# Patient Record
Sex: Female | Born: 1965 | Race: White | Hispanic: No | Marital: Married | Smoking: Never smoker
Health system: Southern US, Community
[De-identification: ages and names within clinical notes are randomized; demographics above are authoritative.]

## PROBLEM LIST (undated history)

## (undated) DIAGNOSIS — I10 Essential (primary) hypertension: Secondary | ICD-10-CM

## (undated) DIAGNOSIS — E119 Type 2 diabetes mellitus without complications: Secondary | ICD-10-CM

## (undated) DIAGNOSIS — IMO0002 Reserved for concepts with insufficient information to code with codable children: Secondary | ICD-10-CM

## (undated) DIAGNOSIS — C801 Malignant (primary) neoplasm, unspecified: Secondary | ICD-10-CM

## (undated) HISTORY — PX: THYROIDECTOMY: SHX17

---

## 2010-03-17 DIAGNOSIS — C73 Malignant neoplasm of thyroid gland: Secondary | ICD-10-CM | POA: Insufficient documentation

## 2010-08-12 DIAGNOSIS — H8090 Unspecified otosclerosis, unspecified ear: Secondary | ICD-10-CM | POA: Insufficient documentation

## 2011-01-20 DIAGNOSIS — H902 Conductive hearing loss, unspecified: Secondary | ICD-10-CM | POA: Insufficient documentation

## 2012-08-24 DIAGNOSIS — R911 Solitary pulmonary nodule: Secondary | ICD-10-CM | POA: Insufficient documentation

## 2012-12-21 DIAGNOSIS — M5414 Radiculopathy, thoracic region: Secondary | ICD-10-CM | POA: Insufficient documentation

## 2013-02-10 DIAGNOSIS — M722 Plantar fascial fibromatosis: Secondary | ICD-10-CM | POA: Insufficient documentation

## 2013-09-11 DIAGNOSIS — E785 Hyperlipidemia, unspecified: Secondary | ICD-10-CM | POA: Insufficient documentation

## 2013-10-03 ENCOUNTER — Encounter: Payer: Self-pay | Admitting: Sports Medicine

## 2013-10-03 ENCOUNTER — Ambulatory Visit (INDEPENDENT_AMBULATORY_CARE_PROVIDER_SITE_OTHER): Payer: BC Managed Care – PPO | Admitting: Sports Medicine

## 2013-10-03 VITALS — BP 155/88 | HR 70 | Wt 188.0 lb

## 2013-10-03 DIAGNOSIS — M722 Plantar fascial fibromatosis: Secondary | ICD-10-CM | POA: Insufficient documentation

## 2013-10-03 NOTE — Progress Notes (Signed)
Patient ID: Nancy Poole, female   DOB: 1966/10/14, 47 y.o.   MRN: [id number]  Subjective:    CC:  "Both heels hurt when I stand for a long time"  HPI: This is a 47 year old woman who comes in today for pain at the heel of her feet after long period of time. She reports the pain started around march this year. She works at [employer] where she stands on her feet for over 6 hours of the day. Since September, the pain has gotten worse because she now stands on her feet for about 12 hours at time.  She has tried NSAID, ice compression and night splint to no avail. The pain radiates to both big toes but does not radiate to her legs. She is interested in getting a shot today.  Past medical history, Surgical history, Family history not pertinant except as noted below, Social history, Allergies, and medications have been entered into the medical record, reviewed, and no changes needed.   Review of Systems: No headache, visual changes, nausea, vomiting, diarrhea, constipation, dizziness, abdominal pain, skin rash, fevers, chills, night sweats, swollen lymph nodes, weight loss, chest pain, body aches, joint swelling, muscle aches, shortness of breath, mood changes, visual or auditory hallucinations.  Objective:    General: Well Developed, well nourished, and in no acute distress.  Neuro: Alert and oriented x3, extra-ocular muscles intact, sensation grossly intact.  HEENT: Normocephalic, atraumatic, pupils equal round reactive to light, neck supple, no masses, no lymphadenopathy, thyroid nonpalpable.  Skin: Warm and dry, no rashes noted.  Cardiac: Regular rate and rhythm, no murmurs rubs or gallops.  Respiratory: Clear to auscultation bilaterally. Not using accessory muscles, speaking in full sentences.  Abdominal: Soft, nontender, nondistended, positive bowel sounds, no masses, no organomegaly.  Bilateral feet: No visible erythema or swelling. Range of motion is full in all directions. Strength  is 5/5 in all directions. No hallux valgus. Bilateral pes cavus No abnormal callus noted. No pain over the navicular prominence, or base of fifth metatarsal. Tender to palpation of the calcaneal insertion of plantar fascia bilaterally. No pain at the Achilles insertion. No pain over the calcaneal bursa. No pain of the retrocalcaneal bursa. No tenderness to palpation over the tarsals, metatarsals, or phalanges. No hallux rigidus or limitus. No tenderness palpation over interphalangeal joints. No pain with compression of the metatarsal heads. Neurovascularly intact distally.  Procedure: Real-time Ultrasound Guided Injection of left plantar fascia Device: GE Logiq E  Verbal informed consent obtained.  Time-out conducted.  Noted no overlying erythema, induration, or other signs of local infection.  Skin prepped in a sterile fashion.  Local anesthesia: Topical Ethyl chloride.  With sterile technique and under real time ultrasound guidance:  25-gauge needle advanced just deep to the calcaneal insertion of the plantar fascia, 1 cc Kenalog 40, 3 cc lidocaine injected easily. Completed without difficulty  Pain immediately resolved suggesting accurate placement of the medication.  Advised to call if fevers/chills, erythema, induration, drainage, or persistent bleeding.  Images permanently stored and available for review in the ultrasound unit.  Impression: Technically successful ultrasound guided injection.  Procedure: Real-time Ultrasound Guided Injection of right plantar fascia Device: GE Logiq E  Verbal informed consent obtained.  Time-out conducted.  Noted no overlying erythema, induration, or other signs of local infection.  Skin prepped in a sterile fashion.  Local anesthesia: Topical Ethyl chloride.  With sterile technique and under real time ultrasound guidance:  25-gauge needle advanced just deep to the  calcaneal insertion of the plantar fascia, 1 cc Kenalog 40, 3 cc lidocaine  injected easily. Completed without difficulty  Pain immediately resolved suggesting accurate placement of the medication.  Advised to call if fevers/chills, erythema, induration, drainage, or persistent bleeding.  Images permanently stored and available for review in the ultrasound unit.  Impression: Technically successful ultrasound guided injection.  Impression and Recommendations:    The patient was counselled, risk factors were discussed, anticipatory guidance given.  Plantar Fascitis: -We will inject both feet with triamcinolone and lidocaine . She was given home exercise handout on plantar fascitis -Patient advised to make follow up for  make custom orthotics  -Follow up in a month to monitor improvement

## 2013-10-03 NOTE — Assessment & Plan Note (Signed)
Failed conservative measures. Bilateral injection as above. Return to see me for custom orthotics. Home exercises given.

## 2013-10-23 ENCOUNTER — Encounter: Payer: Self-pay | Admitting: Sports Medicine

## 2013-10-23 ENCOUNTER — Ambulatory Visit (INDEPENDENT_AMBULATORY_CARE_PROVIDER_SITE_OTHER): Payer: BC Managed Care – PPO | Admitting: Sports Medicine

## 2013-10-23 VITALS — BP 150/92 | HR 84 | Wt 184.0 lb

## 2013-10-23 DIAGNOSIS — Z299 Encounter for prophylactic measures, unspecified: Secondary | ICD-10-CM

## 2013-10-23 DIAGNOSIS — M722 Plantar fascial fibromatosis: Secondary | ICD-10-CM

## 2013-10-23 DIAGNOSIS — Z0000 Encounter for general adult medical examination without abnormal findings: Secondary | ICD-10-CM | POA: Insufficient documentation

## 2013-10-23 DIAGNOSIS — K219 Gastro-esophageal reflux disease without esophagitis: Secondary | ICD-10-CM | POA: Insufficient documentation

## 2013-10-23 DIAGNOSIS — Z23 Encounter for immunization: Secondary | ICD-10-CM

## 2013-10-23 MED ORDER — OMEPRAZOLE 40 MG PO CPDR: 40.0000 mg | DELAYED_RELEASE_CAPSULE | Freq: Every day | ORAL | Status: DC

## 2013-10-23 NOTE — Assessment & Plan Note (Signed)
Flu shot

## 2013-10-23 NOTE — Assessment & Plan Note (Signed)
Doing much better after the injection. Custom orthotics as above. Return in one month, consider repeat injection if no better.

## 2013-10-23 NOTE — Progress Notes (Signed)
    Patient was fitted for a : standard, cushioned, semi-rigid orthotic. The orthotic was heated and afterward the patient stood on the orthotic blank positioned on the orthotic stand. The patient was positioned in subtalar neutral position and 10 degrees of ankle dorsiflexion in a weight bearing stance. After completion of molding, a stable base was applied to the orthotic blank. The blank was ground to a stable position for weight bearing. Size: 7 Base: Blue EVA Additional Posting and Padding: None The patient ambulated these, and they were very comfortable.  I spent 40 minutes with this patient, greater than 50% was face-to-face time counseling regarding the below diagnosis.   

## 2013-10-23 NOTE — Assessment & Plan Note (Signed)
Refilling Prilosec.

## 2013-11-13 ENCOUNTER — Encounter: Payer: Self-pay | Admitting: Sports Medicine

## 2013-11-13 ENCOUNTER — Ambulatory Visit (INDEPENDENT_AMBULATORY_CARE_PROVIDER_SITE_OTHER): Payer: BC Managed Care – PPO | Admitting: Sports Medicine

## 2013-11-13 ENCOUNTER — Other Ambulatory Visit: Payer: Self-pay | Admitting: Sports Medicine

## 2013-11-13 ENCOUNTER — Ambulatory Visit (INDEPENDENT_AMBULATORY_CARE_PROVIDER_SITE_OTHER): Payer: BC Managed Care – PPO

## 2013-11-13 VITALS — BP 148/86 | HR 78 | Wt 191.0 lb

## 2013-11-13 DIAGNOSIS — M722 Plantar fascial fibromatosis: Secondary | ICD-10-CM

## 2013-11-13 DIAGNOSIS — M5410 Radiculopathy, site unspecified: Secondary | ICD-10-CM | POA: Insufficient documentation

## 2013-11-13 DIAGNOSIS — M47817 Spondylosis without myelopathy or radiculopathy, lumbosacral region: Secondary | ICD-10-CM

## 2013-11-13 DIAGNOSIS — M5416 Radiculopathy, lumbar region: Secondary | ICD-10-CM

## 2013-11-13 DIAGNOSIS — IMO0002 Reserved for concepts with insufficient information to code with codable children: Secondary | ICD-10-CM

## 2013-11-13 DIAGNOSIS — M961 Postlaminectomy syndrome, not elsewhere classified: Secondary | ICD-10-CM | POA: Insufficient documentation

## 2013-11-13 DIAGNOSIS — Z1231 Encounter for screening mammogram for malignant neoplasm of breast: Secondary | ICD-10-CM

## 2013-11-13 MED ORDER — MELOXICAM 15 MG PO TABS: ORAL_TABLET | ORAL | Status: DC

## 2013-11-13 MED ORDER — GABAPENTIN 300 MG PO CAPS: ORAL_CAPSULE | ORAL | Status: DC

## 2013-11-13 NOTE — Assessment & Plan Note (Signed)
Resolved with injection and orthotics. Mobic as needed. Return to see me as needed for this.

## 2013-11-13 NOTE — Progress Notes (Signed)
  Subjective:    CC: Follow up  HPI: Plantar fasciitis: Essentially resolved after bilateral injections into custom orthotics.  Lumbar radiculitis: Has a history of a herniated disc, was seen at the [facility], conservative measures were instituted including physical therapy and she recovered. Now she is having a recurrence of pain that she localizes in the right L5 versus S1 distribution, worse with riding in a car, and predominantly discogenic, moderate, persistent. She does desire to pursue further conservative measures.  Past medical history, Surgical history, Family history not pertinant except as noted below, Social history, Allergies, and medications have been entered into the medical record, reviewed, and no changes needed.   Review of Systems: No fevers, chills, night sweats, weight loss, chest pain, or shortness of breath.   Objective:    General: Well Developed, well nourished, and in no acute distress.  Neuro: Alert and oriented x3, extra-ocular muscles intact, sensation grossly intact.  HEENT: Normocephalic, atraumatic, pupils equal round reactive to light, neck supple, no masses, no lymphadenopathy, thyroid nonpalpable.  Skin: Warm and dry, no rashes. Cardiac: Regular rate and rhythm, no murmurs rubs or gallops, no lower extremity edema.  Respiratory: Clear to auscultation bilaterally. Not using accessory muscles, speaking in full sentences. Back Exam:  Inspection: Unremarkable  Motion: Flexion 45 deg, Extension 45 deg, Side Bending to 45 deg bilaterally,  Rotation to 45 deg bilaterally  SLR laying: Negative  XSLR laying: Negative  Palpable tenderness: None. FABER: negative. Sensory change: Gross sensation intact to all lumbar and sacral dermatomes.  Reflexes: 2+ at both patellar tendons, 2+ at achilles tendons, Babinski's downgoing.  Strength at foot  Plantar-flexion: 5/5 Dorsi-flexion: 5/5 Eversion: 5/5 Inversion: 5/5  Leg strength  Quad: 5/5 Hamstring: 5/5 Hip  flexor: 5/5 Hip abductors: 5/5  Gait unremarkable.  Lumbar spine x-rays were personally reviewed and are fairly normal with the exception of a very slight loss of disc height at the L4-L5 level with anterior osteophytic spurring.  Impression and Recommendations:

## 2013-11-13 NOTE — Assessment & Plan Note (Signed)
She has been to the [facility] in the distant past. Conservative measures resolve her symptoms. We are going to repeat this with gabapentin, x-rays, return to see me in 4-6 weeks.

## 2013-11-16 ENCOUNTER — Ambulatory Visit: Payer: BC Managed Care – PPO

## 2013-11-17 ENCOUNTER — Ambulatory Visit: Payer: BC Managed Care – PPO | Attending: Sports Medicine | Admitting: Rehabilitation

## 2013-11-17 DIAGNOSIS — M79609 Pain in unspecified limb: Secondary | ICD-10-CM | POA: Insufficient documentation

## 2013-11-17 DIAGNOSIS — IMO0001 Reserved for inherently not codable concepts without codable children: Secondary | ICD-10-CM | POA: Insufficient documentation

## 2013-11-17 DIAGNOSIS — M545 Low back pain, unspecified: Secondary | ICD-10-CM | POA: Insufficient documentation

## 2013-11-21 ENCOUNTER — Ambulatory Visit (INDEPENDENT_AMBULATORY_CARE_PROVIDER_SITE_OTHER): Payer: BC Managed Care – PPO

## 2013-11-21 ENCOUNTER — Ambulatory Visit: Payer: BC Managed Care – PPO | Admitting: Rehabilitation

## 2013-11-21 DIAGNOSIS — Z1231 Encounter for screening mammogram for malignant neoplasm of breast: Secondary | ICD-10-CM

## 2013-11-21 DIAGNOSIS — R928 Other abnormal and inconclusive findings on diagnostic imaging of breast: Secondary | ICD-10-CM

## 2013-11-23 ENCOUNTER — Other Ambulatory Visit: Payer: Self-pay | Admitting: Sports Medicine

## 2013-11-23 DIAGNOSIS — R928 Other abnormal and inconclusive findings on diagnostic imaging of breast: Secondary | ICD-10-CM

## 2013-11-24 ENCOUNTER — Ambulatory Visit: Payer: BC Managed Care – PPO | Admitting: Rehabilitation

## 2013-11-27 ENCOUNTER — Ambulatory Visit: Payer: BC Managed Care – PPO | Admitting: Rehabilitation

## 2013-11-29 ENCOUNTER — Other Ambulatory Visit: Payer: BC Managed Care – PPO

## 2013-12-01 ENCOUNTER — Ambulatory Visit: Payer: BC Managed Care – PPO | Admitting: Rehabilitation

## 2013-12-04 ENCOUNTER — Ambulatory Visit: Payer: BC Managed Care – PPO | Admitting: Rehabilitation

## 2013-12-05 ENCOUNTER — Ambulatory Visit
Admission: RE | Admit: 2013-12-05 | Discharge: 2013-12-05 | Disposition: A | Discharge status: Home / Self Care | Payer: BC Managed Care – PPO | Source: Ambulatory Visit | Attending: Sports Medicine | Admitting: Sports Medicine

## 2013-12-05 DIAGNOSIS — R928 Other abnormal and inconclusive findings on diagnostic imaging of breast: Secondary | ICD-10-CM

## 2013-12-08 ENCOUNTER — Ambulatory Visit: Payer: BC Managed Care – PPO | Admitting: Rehabilitation

## 2013-12-11 ENCOUNTER — Ambulatory Visit: Payer: BC Managed Care – PPO | Admitting: Rehabilitation

## 2013-12-15 ENCOUNTER — Ambulatory Visit: Payer: BC Managed Care – PPO | Attending: Sports Medicine | Admitting: Rehabilitation

## 2013-12-15 DIAGNOSIS — M79609 Pain in unspecified limb: Secondary | ICD-10-CM | POA: Insufficient documentation

## 2013-12-15 DIAGNOSIS — M545 Low back pain, unspecified: Secondary | ICD-10-CM | POA: Insufficient documentation

## 2013-12-15 DIAGNOSIS — IMO0001 Reserved for inherently not codable concepts without codable children: Secondary | ICD-10-CM | POA: Insufficient documentation

## 2013-12-18 ENCOUNTER — Ambulatory Visit: Payer: BC Managed Care – PPO | Admitting: Rehabilitation

## 2013-12-20 ENCOUNTER — Telehealth: Payer: Self-pay | Admitting: *Deleted

## 2013-12-20 ENCOUNTER — Encounter: Payer: Self-pay | Admitting: Sports Medicine

## 2013-12-20 ENCOUNTER — Ambulatory Visit (INDEPENDENT_AMBULATORY_CARE_PROVIDER_SITE_OTHER): Payer: BC Managed Care – PPO | Admitting: Sports Medicine

## 2013-12-20 VITALS — BP 154/90 | HR 90 | Ht 66.0 in | Wt 191.0 lb

## 2013-12-20 DIAGNOSIS — IMO0002 Reserved for concepts with insufficient information to code with codable children: Secondary | ICD-10-CM

## 2013-12-20 DIAGNOSIS — R030 Elevated blood-pressure reading, without diagnosis of hypertension: Secondary | ICD-10-CM

## 2013-12-20 DIAGNOSIS — G4700 Insomnia, unspecified: Secondary | ICD-10-CM

## 2013-12-20 DIAGNOSIS — M5416 Radiculopathy, lumbar region: Secondary | ICD-10-CM

## 2013-12-20 DIAGNOSIS — M722 Plantar fascial fibromatosis: Secondary | ICD-10-CM

## 2013-12-20 DIAGNOSIS — I10 Essential (primary) hypertension: Secondary | ICD-10-CM | POA: Insufficient documentation

## 2013-12-20 MED ORDER — GABAPENTIN 600 MG PO TABS: ORAL_TABLET | ORAL | Status: DC

## 2013-12-20 MED ORDER — MELOXICAM 15 MG PO TABS: 15.0000 mg | ORAL_TABLET | Freq: Every day | ORAL | Status: DC

## 2013-12-20 NOTE — Progress Notes (Signed)
  Subjective:    CC:  followup  HPI: Nancy Poole comes in with multiple questions, some of them were discussed but the majority were deferred to future visits.  Lumbar radiculitis: Not improved with physical therapy, okay with gabapentin, needs refill.  Bilateral plantar fasciitis: Resolved after injection and orthotics.  Insomnia: Discussed this somewhat, but amenable to go into detail at a future visit.  Past medical history, Surgical history, Family history not pertinant except as noted below, Social history, Allergies, and medications have been entered into the medical record, reviewed, and no changes needed.   Review of Systems: No fevers, chills, night sweats, weight loss, chest pain, or shortness of breath.   Objective:    General: Well Developed, well nourished, and in no acute distress.  Neuro: Alert and oriented x3, extra-ocular muscles intact, sensation grossly intact.  HEENT: Normocephalic, atraumatic, pupils equal round reactive to light, neck supple, no masses, no lymphadenopathy, thyroid nonpalpable.  Skin: Warm and dry, no rashes. Cardiac: Regular rate and rhythm, no murmurs rubs or gallops, no lower extremity edema.  Respiratory: Clear to auscultation bilaterally. Not using accessory muscles, speaking in full sentences.  Impression and Recommendations:    I spent 40 minutes with this patient, greater than 50% was face-to-face time in counseling regarding the above diagnoses.

## 2013-12-20 NOTE — Assessment & Plan Note (Signed)
Patient agrees to discuss this in further detail at a future visit.

## 2013-12-20 NOTE — Assessment & Plan Note (Signed)
Resolved after injection and orthotics. 

## 2013-12-20 NOTE — Assessment & Plan Note (Signed)
MRI for interventional injection planning. Increasing gabapentin. Return to go over MRI results.

## 2013-12-20 NOTE — Telephone Encounter (Signed)
No PA required for MRI Lumbar spine w/o.  Oscar La, LPN

## 2013-12-20 NOTE — Assessment & Plan Note (Signed)
We will keep an eye on this.

## 2013-12-22 ENCOUNTER — Ambulatory Visit: Payer: BC Managed Care – PPO | Admitting: Rehabilitation

## 2013-12-25 ENCOUNTER — Ambulatory Visit: Payer: BC Managed Care – PPO | Admitting: Sports Medicine

## 2013-12-26 ENCOUNTER — Ambulatory Visit (HOSPITAL_BASED_OUTPATIENT_CLINIC_OR_DEPARTMENT_OTHER): Payer: BC Managed Care – PPO

## 2013-12-27 ENCOUNTER — Telehealth: Payer: Self-pay

## 2013-12-27 NOTE — Telephone Encounter (Signed)
Spoke to patient she stated that it is showing 2 charges she has an appt scheduled for Monday she wants to know if you can print what she was charged so she can submit it to her insurance. Rhonda Cunningham,CMA

## 2013-12-27 NOTE — Telephone Encounter (Signed)
Message copied by Doree Albee on Wed Dec 27, 2013  5:01 PM ------      Message from: Bayard Hugger      Created: Mon Dec 25, 2013  8:57 PM       Hi Suanne Marker,            Just wanted you to know that I only saw 1 flu injection charge. Please let pt know.            Thanks      M ------

## 2013-12-29 ENCOUNTER — Ambulatory Visit: Payer: BC Managed Care – PPO | Admitting: Sports Medicine

## 2013-12-30 ENCOUNTER — Ambulatory Visit (HOSPITAL_BASED_OUTPATIENT_CLINIC_OR_DEPARTMENT_OTHER)
Admission: RE | Admit: 2013-12-30 | Discharge: 2013-12-30 | Disposition: A | Discharge status: Home / Self Care | Payer: BC Managed Care – PPO | Source: Ambulatory Visit | Attending: Sports Medicine | Admitting: Sports Medicine

## 2013-12-30 DIAGNOSIS — M5126 Other intervertebral disc displacement, lumbar region: Secondary | ICD-10-CM | POA: Insufficient documentation

## 2013-12-30 DIAGNOSIS — M48061 Spinal stenosis, lumbar region without neurogenic claudication: Secondary | ICD-10-CM | POA: Insufficient documentation

## 2013-12-30 DIAGNOSIS — M5416 Radiculopathy, lumbar region: Secondary | ICD-10-CM

## 2014-01-01 ENCOUNTER — Ambulatory Visit: Payer: BC Managed Care – PPO | Admitting: Sports Medicine

## 2014-01-03 ENCOUNTER — Encounter: Payer: Self-pay | Admitting: Sports Medicine

## 2014-01-03 ENCOUNTER — Ambulatory Visit (INDEPENDENT_AMBULATORY_CARE_PROVIDER_SITE_OTHER): Payer: BC Managed Care – PPO | Admitting: Sports Medicine

## 2014-01-03 VITALS — BP 151/83 | HR 79 | Ht 66.0 in | Wt 193.0 lb

## 2014-01-03 DIAGNOSIS — IMO0002 Reserved for concepts with insufficient information to code with codable children: Secondary | ICD-10-CM

## 2014-01-03 DIAGNOSIS — M5416 Radiculopathy, lumbar region: Secondary | ICD-10-CM

## 2014-01-03 DIAGNOSIS — E669 Obesity, unspecified: Secondary | ICD-10-CM

## 2014-01-03 DIAGNOSIS — R635 Abnormal weight gain: Secondary | ICD-10-CM | POA: Insufficient documentation

## 2014-01-03 MED ORDER — PHENTERMINE HCL 37.5 MG PO CAPS: 37.5000 mg | ORAL_CAPSULE | ORAL | Status: DC

## 2014-01-03 NOTE — Assessment & Plan Note (Signed)
MRI does show a fairly large middle L4-L5 disc protrusion. Unfortunately she has failed oral steroids, therapy, neuropathic agents. We are going to proceed with a right-sided L4-L5 interlaminar epidural steroid injection. Return to see me in about 2 weeks to go over response to the injection.

## 2014-01-03 NOTE — Progress Notes (Signed)
  Subjective:    CC: Follow up  HPI: Lumbar radiculopathy: Right-sided, radiates down the right leg in an L4 distribution, moderate, persistent, failed steroids, NSAIDs, muscle relaxers.  Obesity: Desires for weight loss medication.  Past medical history, Surgical history, Family history not pertinant except as noted below, Social history, Allergies, and medications have been entered into the medical record, reviewed, and no changes needed.   Review of Systems: No fevers, chills, night sweats, weight loss, chest pain, or shortness of breath.   Objective:    General: Well Developed, well nourished, and in no acute distress.  Neuro: Alert and oriented x3, extra-ocular muscles intact, sensation grossly intact.  HEENT: Normocephalic, atraumatic, pupils equal round reactive to light, neck supple, no masses, no lymphadenopathy, thyroid nonpalpable.  Skin: Warm and dry, no rashes. Cardiac: Regular rate and rhythm, no murmurs rubs or gallops, no lower extremity edema.  Respiratory: Clear to auscultation bilaterally. Not using accessory muscles, speaking in full sentences.  MRI was personally reviewed, there is a large midline disc protrusion at the L4-L5 level.  Impression and Recommendations:

## 2014-01-03 NOTE — Assessment & Plan Note (Signed)
Starting phentermine. Return monthly for weight checks and refills. 

## 2014-01-08 ENCOUNTER — Ambulatory Visit
Admission: RE | Admit: 2014-01-08 | Discharge: 2014-01-08 | Disposition: A | Discharge status: Home / Self Care | Payer: BC Managed Care – PPO | Source: Ambulatory Visit | Attending: Sports Medicine | Admitting: Sports Medicine

## 2014-01-08 MED ORDER — IOHEXOL 180 MG/ML  SOLN
1.0000 mL | Freq: Once | INTRAMUSCULAR | Status: AC | PRN
  Administered 2014-01-08: 1 mL via EPIDURAL

## 2014-01-08 MED ORDER — METHYLPREDNISOLONE ACETATE 40 MG/ML INJ SUSP (RADIOLOG
120.0000 mg | Freq: Once | INTRAMUSCULAR | Status: AC
  Administered 2014-01-08: 120 mg via EPIDURAL

## 2014-01-08 NOTE — Discharge Instructions (Signed)

## 2014-01-09 ENCOUNTER — Telehealth: Payer: Self-pay | Admitting: Sports Medicine

## 2014-01-09 MED ORDER — MELOXICAM 15 MG PO TABS: ORAL_TABLET | ORAL | Status: DC

## 2014-01-09 NOTE — Telephone Encounter (Signed)
Patient called stated she had an injection yesterday and is in pain and request mobic or maybe cymbalta for the pain.  thanks

## 2014-01-09 NOTE — Telephone Encounter (Signed)
Calling in [place], this is typical after an epidural, the steroid will usually start working at 4-7 days.

## 2014-01-29 ENCOUNTER — Encounter: Payer: Self-pay | Admitting: Sports Medicine

## 2014-01-29 ENCOUNTER — Ambulatory Visit (INDEPENDENT_AMBULATORY_CARE_PROVIDER_SITE_OTHER): Payer: BC Managed Care – PPO | Admitting: Sports Medicine

## 2014-01-29 ENCOUNTER — Encounter (INDEPENDENT_AMBULATORY_CARE_PROVIDER_SITE_OTHER): Payer: Self-pay

## 2014-01-29 VITALS — BP 152/81 | HR 101 | Ht 66.0 in | Wt 185.0 lb

## 2014-01-29 DIAGNOSIS — IMO0002 Reserved for concepts with insufficient information to code with codable children: Secondary | ICD-10-CM

## 2014-01-29 DIAGNOSIS — E669 Obesity, unspecified: Secondary | ICD-10-CM

## 2014-01-29 DIAGNOSIS — H6120 Impacted cerumen, unspecified ear: Secondary | ICD-10-CM

## 2014-01-29 DIAGNOSIS — M5416 Radiculopathy, lumbar region: Secondary | ICD-10-CM

## 2014-01-29 DIAGNOSIS — M722 Plantar fascial fibromatosis: Secondary | ICD-10-CM

## 2014-01-29 MED ORDER — MAGNESIUM OXIDE 400 MG PO TABS: 800.0000 mg | ORAL_TABLET | Freq: Every day | ORAL | Status: DC

## 2014-01-29 MED ORDER — PHENTERMINE HCL 37.5 MG PO CAPS: 37.5000 mg | ORAL_CAPSULE | ORAL | Status: DC

## 2014-01-29 MED ORDER — NALTREXONE-BUPROPION HCL ER 8-90 MG PO TB12: ORAL_TABLET | ORAL | Status: DC

## 2014-01-29 NOTE — Assessment & Plan Note (Signed)
Overall resolved with custom orthotics.

## 2014-01-29 NOTE — Assessment & Plan Note (Signed)
Improved after epidural, does not get as much cramping at night. Adding additional magnesium for help with cramping.

## 2014-01-29 NOTE — Assessment & Plan Note (Signed)
Bilateral removal with instrumentation by physician.

## 2014-01-29 NOTE — Progress Notes (Signed)
  Subjective:    CC: Followup   HPI: Obesity: 8 pound weight loss since starting phentermine, she has no side effects but desires to add additional weight loss medicine.  Plantar fasciitis: Continues to be essentially resolved with custom orthotics and after injection.  Lumbar degenerative disc disease: Was having predominantly right-sided nocturnal leg cramps, these have all but resolved after her epidural.  Past medical history, Surgical history, Family history not pertinant except as noted below, Social history, Allergies, and medications have been entered into the medical record, reviewed, and no changes needed.   Review of Systems: No fevers, chills, night sweats, weight loss, chest pain, or shortness of breath.   Objective:    General: Well Developed, well nourished, and in no acute distress.  Neuro: Alert and oriented x3, extra-ocular muscles intact, sensation grossly intact.  HEENT: Normocephalic, atraumatic, pupils equal round reactive to light, neck supple, no masses, no lymphadenopathy, thyroid nonpalpable.  Skin: Warm and dry, no rashes. Cardiac: Regular rate and rhythm, no murmurs rubs or gallops, no lower extremity edema.  Respiratory: Clear to auscultation bilaterally. Not using accessory muscles, speaking in full sentences.  Impression and Recommendations:

## 2014-01-29 NOTE — Assessment & Plan Note (Signed)
8 lb weight loss since starting phentermine, per patient request adding Contrave as well. Refilling phentermine, return in one month.

## 2014-03-02 ENCOUNTER — Encounter: Payer: Self-pay | Admitting: Sports Medicine

## 2014-03-02 ENCOUNTER — Ambulatory Visit (INDEPENDENT_AMBULATORY_CARE_PROVIDER_SITE_OTHER): Payer: BC Managed Care – PPO | Admitting: Sports Medicine

## 2014-03-02 VITALS — BP 171/86 | HR 87 | Ht 66.0 in | Wt 176.0 lb

## 2014-03-02 DIAGNOSIS — E669 Obesity, unspecified: Secondary | ICD-10-CM

## 2014-03-02 DIAGNOSIS — R300 Dysuria: Secondary | ICD-10-CM | POA: Insufficient documentation

## 2014-03-02 DIAGNOSIS — IMO0002 Reserved for concepts with insufficient information to code with codable children: Secondary | ICD-10-CM

## 2014-03-02 DIAGNOSIS — M722 Plantar fascial fibromatosis: Secondary | ICD-10-CM

## 2014-03-02 LAB — POCT URINALYSIS DIPSTICK
Bilirubin, UA: NEGATIVE
Glucose, UA: NEGATIVE
Leukocytes, UA: NEGATIVE
Nitrite, UA: NEGATIVE
Spec Grav, UA: 1.02
Urobilinogen, UA: 0.2
pH, UA: 7

## 2014-03-02 MED ORDER — ONDANSETRON 8 MG PO TBDP: ORAL_TABLET | ORAL | Status: DC

## 2014-03-02 MED ORDER — PHENTERMINE HCL 37.5 MG PO CAPS: 37.5000 mg | ORAL_CAPSULE | ORAL | Status: DC

## 2014-03-02 MED ORDER — CEPHALEXIN 500 MG PO CAPS: 500.0000 mg | ORAL_CAPSULE | Freq: Two times a day (BID) | ORAL | Status: DC

## 2014-03-02 MED ORDER — NALTREXONE-BUPROPION HCL ER 8-90 MG PO TB12: 2.0000 | ORAL_TABLET | Freq: Two times a day (BID) | ORAL | Status: DC

## 2014-03-02 NOTE — Assessment & Plan Note (Signed)
Continue both Contrave and phentermine, adding Zofran as she did have some difficulty taking her morning Contrave dose.

## 2014-03-02 NOTE — Assessment & Plan Note (Signed)
Orthotics were modified. Grinding down the tips where her toes overlapped over the edge.

## 2014-03-02 NOTE — Assessment & Plan Note (Signed)
Trace blood, adding urine culture, Keflex. Return as needed for this.

## 2014-03-02 NOTE — Progress Notes (Signed)
  Subjective:    CC: Followup  HPI: Obesity: Good weight loss with phentermine combined with Contrave, unfortunately she had some nausea with the full dose of Contrave the morning and has only been taking the evening dose.  Urination: Difficult urination, dribbling, has to go multiple times and strain, no burning, no flank pain.  Plantar fasciitis: Her thoughts are working well but she does feel as though her toes tip over the distal edge of the orthotic.  Past medical history, Surgical history, Family history not pertinant except as noted below, Social history, Allergies, and medications have been entered into the medical record, reviewed, and no changes needed.   Review of Systems: No fevers, chills, night sweats, weight loss, chest pain, or shortness of breath.   Objective:    General: Well Developed, well nourished, and in no acute distress.  Neuro: Alert and oriented x3, extra-ocular muscles intact, sensation grossly intact.  HEENT: Normocephalic, atraumatic, pupils equal round reactive to light, neck supple, no masses, no lymphadenopathy, thyroid nonpalpable.  Skin: Warm and dry, no rashes. Cardiac: Regular rate and rhythm, no murmurs rubs or gallops, no lower extremity edema.  Respiratory: Clear to auscultation bilaterally. Not using accessory muscles, speaking in full sentences. Abdomen: Soft, nontender, nondistended, normal bowel sounds, no palpable masses, no costovertebral angle tenderness.  Tip of the orthotics were ground down to provide a smooth surface for her toes would not dip over the edge.  Urinalysis was negative with the exception of trace blood.  Impression and Recommendations:    I spent 40 minutes with this patient, greater than 50% was face-to-face time counseling regarding the above multiple diagnoses.

## 2014-03-04 LAB — URINE CULTURE
Colony Count: NO GROWTH
Organism ID, Bacteria: NO GROWTH

## 2014-03-06 ENCOUNTER — Ambulatory Visit (INDEPENDENT_AMBULATORY_CARE_PROVIDER_SITE_OTHER): Payer: BC Managed Care – PPO | Admitting: Sports Medicine

## 2014-03-06 ENCOUNTER — Encounter: Payer: Self-pay | Admitting: Sports Medicine

## 2014-03-06 VITALS — BP 170/100 | HR 80

## 2014-03-06 DIAGNOSIS — M722 Plantar fascial fibromatosis: Secondary | ICD-10-CM

## 2014-03-06 DIAGNOSIS — I10 Essential (primary) hypertension: Secondary | ICD-10-CM

## 2014-03-06 DIAGNOSIS — E669 Obesity, unspecified: Secondary | ICD-10-CM

## 2014-03-06 DIAGNOSIS — D509 Iron deficiency anemia, unspecified: Secondary | ICD-10-CM

## 2014-03-06 MED ORDER — ONDANSETRON 8 MG PO TBDP: ORAL_TABLET | ORAL | Status: DC

## 2014-03-06 MED ORDER — AMLODIPINE BESYLATE 10 MG PO TABS: 5.0000 mg | ORAL_TABLET | Freq: Every day | ORAL | Status: DC

## 2014-03-06 MED ORDER — PHENTERMINE HCL 15 MG PO CAPS: 15.0000 mg | ORAL_CAPSULE | ORAL | Status: DC

## 2014-03-06 NOTE — Addendum Note (Signed)
Addended by: Silverio Decamp on: 03/06/2014 12:03 PM   Modules accepted: Level of Service

## 2014-03-06 NOTE — Assessment & Plan Note (Signed)
Added a first metatarsal ray post on the right orthotic.

## 2014-03-06 NOTE — Assessment & Plan Note (Signed)
Some dysuria, what pressures were elevated today. We are going to decrease phentermine 15 mg.

## 2014-03-06 NOTE — Assessment & Plan Note (Signed)
Starting amlodipine. Return in one month for both a weight check and a blood pressure check.

## 2014-03-06 NOTE — Progress Notes (Signed)
Patient refused vital signs. Rhonda Cunningham,CMA

## 2014-03-06 NOTE — Progress Notes (Addendum)
  Subjective:    CC: Followup  HPI: Plantar fasciitis: Cecille Rubin returns, we shaved down the tip of her orthotics, now she feels as though it is not push and then off. This is predominately over her first metatarsal ray, and not over the smaller toes.  Obesity: Continues to do well on both phentermine and Contrave. She was getting some nausea, but fortunately the Zofran was too expensive, it sounds as though she was prescribed #30 pills.  Elevated blood pressure: Has now been elevated on every single occasion, no headaches, visual changes.  Past medical history, Surgical history, Family history not pertinant except as noted below, Social history, Allergies, and medications have been entered into the medical record, reviewed, and no changes needed.   Review of Systems: No fevers, chills, night sweats, weight loss, chest pain, or shortness of breath.   Objective:    General: Well Developed, well nourished, and in no acute distress.  Neuro: Alert and oriented x3, extra-ocular muscles intact, sensation grossly intact.  HEENT: Normocephalic, atraumatic, pupils equal round reactive to light, neck supple, no masses, no lymphadenopathy, thyroid nonpalpable.  Skin: Warm and dry, no rashes. Cardiac: Regular rate and rhythm, no murmurs rubs or gallops, no lower extremity edema.  Respiratory: Clear to auscultation bilaterally. Not using accessory muscles, speaking in full sentences.  I added a first metatarsal ray post to her orthotic. This was comfortable.  Impression and Recommendations:    I spent 40 minutes with this patient, greater than 50% was face to face time counseling regarding the above diagnoses.

## 2014-03-07 ENCOUNTER — Encounter: Payer: Self-pay | Admitting: Sports Medicine

## 2014-03-07 ENCOUNTER — Other Ambulatory Visit: Payer: Self-pay | Admitting: Sports Medicine

## 2014-03-07 ENCOUNTER — Other Ambulatory Visit: Payer: Self-pay

## 2014-03-07 DIAGNOSIS — E119 Type 2 diabetes mellitus without complications: Secondary | ICD-10-CM | POA: Insufficient documentation

## 2014-03-07 DIAGNOSIS — D509 Iron deficiency anemia, unspecified: Secondary | ICD-10-CM | POA: Insufficient documentation

## 2014-03-07 DIAGNOSIS — E669 Obesity, unspecified: Secondary | ICD-10-CM

## 2014-03-07 LAB — COMPREHENSIVE METABOLIC PANEL
ALT: 25 U/L (ref 0–35)
AST: 23 U/L (ref 0–37)
Albumin: 3.8 g/dL (ref 3.5–5.2)
Alkaline Phosphatase: 79 U/L (ref 39–117)
BUN: 11 mg/dL (ref 6–23)
CO2: 25 mEq/L (ref 19–32)
Calcium: 8.4 mg/dL (ref 8.4–10.5)
Chloride: 103 mEq/L (ref 96–112)
Creat: 0.92 mg/dL (ref 0.50–1.10)
Glucose, Bld: 126 mg/dL — ABNORMAL HIGH (ref 70–99)
Potassium: 3.9 mEq/L (ref 3.5–5.3)
Sodium: 138 mEq/L (ref 135–145)
Total Bilirubin: 0.3 mg/dL (ref 0.2–1.2)
Total Protein: 6.5 g/dL (ref 6.0–8.3)

## 2014-03-07 LAB — CBC
HCT: 34.2 % — ABNORMAL LOW (ref 36.0–46.0)
Hemoglobin: 11.1 g/dL — ABNORMAL LOW (ref 12.0–15.0)
MCH: 24.9 pg — ABNORMAL LOW (ref 26.0–34.0)
MCHC: 32.5 g/dL (ref 30.0–36.0)
MCV: 76.9 fL — ABNORMAL LOW (ref 78.0–100.0)
Platelets: 360 10*3/uL (ref 150–400)
RBC: 4.45 MIL/uL (ref 3.87–5.11)
RDW: 17.5 % — ABNORMAL HIGH (ref 11.5–15.5)
WBC: 7.1 10*3/uL (ref 4.0–10.5)

## 2014-03-07 LAB — LIPID PANEL
Cholesterol: 201 mg/dL — ABNORMAL HIGH (ref 0–200)
HDL: 60 mg/dL (ref >39)
LDL Cholesterol: 106 mg/dL — ABNORMAL HIGH (ref 0–99)
Total CHOL/HDL Ratio: 3.4 Ratio
Triglycerides: 175 mg/dL — ABNORMAL HIGH (ref <150)
VLDL: 35 mg/dL (ref 0–40)

## 2014-03-07 LAB — T3, FREE: T3, Free: 3 pg/mL (ref 2.3–4.2)

## 2014-03-07 LAB — HEMOGLOBIN A1C
Hgb A1c MFr Bld: 6.5 % — ABNORMAL HIGH (ref <5.7)
Mean Plasma Glucose: 140 mg/dL — ABNORMAL HIGH (ref <117)

## 2014-03-07 LAB — T4, FREE: Free T4: 1.54 ng/dL (ref 0.80–1.80)

## 2014-03-07 LAB — TSH: TSH: 0.126 u[IU]/mL — ABNORMAL LOW (ref 0.350–4.500)

## 2014-03-07 MED ORDER — PHENTERMINE HCL 37.5 MG PO TABS: ORAL_TABLET | ORAL | Status: DC

## 2014-03-07 MED ORDER — ONDANSETRON 8 MG PO TBDP: ORAL_TABLET | ORAL | Status: DC

## 2014-03-07 NOTE — Addendum Note (Signed)
Addended by: Silverio Decamp on: 03/07/2014 09:11 AM   Modules accepted: Orders

## 2014-03-07 NOTE — Assessment & Plan Note (Signed)
Suspect iron deficiency, adding iron studies.

## 2014-03-07 NOTE — Assessment & Plan Note (Signed)
Switching to phentermine 37.5 mg tablets, she will break these in half.

## 2014-03-13 ENCOUNTER — Other Ambulatory Visit: Payer: Self-pay

## 2014-03-13 ENCOUNTER — Telehealth: Payer: Self-pay

## 2014-03-13 DIAGNOSIS — E669 Obesity, unspecified: Secondary | ICD-10-CM

## 2014-03-13 MED ORDER — PHENTERMINE HCL 37.5 MG PO TABS: ORAL_TABLET | ORAL | Status: DC

## 2014-03-13 NOTE — Telephone Encounter (Signed)
Patient request a new Rx for Phentermine in the tablets formula in the 37.5 mg and she can break the tablet in half and still get the dose that you want her to have.  It will also be cheaper. Please advise [place]. Rhonda Cunningham,CMA

## 2014-03-13 NOTE — Telephone Encounter (Signed)
Pt has been informed that Rx has been changed and faxed to [place]. Rhonda Cunningham,CMA

## 2014-03-13 NOTE — Telephone Encounter (Signed)
Yes, prescription is waiting in my box.

## 2014-03-19 ENCOUNTER — Encounter (HOSPITAL_BASED_OUTPATIENT_CLINIC_OR_DEPARTMENT_OTHER): Payer: Self-pay | Admitting: Emergency Medicine

## 2014-03-19 ENCOUNTER — Emergency Department (HOSPITAL_BASED_OUTPATIENT_CLINIC_OR_DEPARTMENT_OTHER)
Admission: EM | Admit: 2014-03-19 | Discharge: 2014-03-20 | Disposition: A | Discharge status: Home / Self Care | Payer: BC Managed Care – PPO | Attending: Emergency Medicine | Admitting: Emergency Medicine

## 2014-03-19 DIAGNOSIS — Z791 Long term (current) use of non-steroidal anti-inflammatories (NSAID): Secondary | ICD-10-CM | POA: Insufficient documentation

## 2014-03-19 DIAGNOSIS — Z79899 Other long term (current) drug therapy: Secondary | ICD-10-CM | POA: Insufficient documentation

## 2014-03-19 DIAGNOSIS — L988 Other specified disorders of the skin and subcutaneous tissue: Secondary | ICD-10-CM | POA: Insufficient documentation

## 2014-03-19 DIAGNOSIS — E119 Type 2 diabetes mellitus without complications: Secondary | ICD-10-CM | POA: Insufficient documentation

## 2014-03-19 DIAGNOSIS — Z8739 Personal history of other diseases of the musculoskeletal system and connective tissue: Secondary | ICD-10-CM | POA: Insufficient documentation

## 2014-03-19 DIAGNOSIS — L959 Vasculitis limited to the skin, unspecified: Secondary | ICD-10-CM

## 2014-03-19 DIAGNOSIS — I10 Essential (primary) hypertension: Secondary | ICD-10-CM | POA: Insufficient documentation

## 2014-03-19 DIAGNOSIS — Z3202 Encounter for pregnancy test, result negative: Secondary | ICD-10-CM | POA: Insufficient documentation

## 2014-03-19 DIAGNOSIS — Z859 Personal history of malignant neoplasm, unspecified: Secondary | ICD-10-CM | POA: Insufficient documentation

## 2014-03-19 HISTORY — DX: Essential (primary) hypertension: I10

## 2014-03-19 HISTORY — DX: Type 2 diabetes mellitus without complications: E11.9

## 2014-03-19 HISTORY — DX: Malignant (primary) neoplasm, unspecified: C80.1

## 2014-03-19 HISTORY — DX: Reserved for concepts with insufficient information to code with codable children: IMO0002

## 2014-03-19 LAB — URINALYSIS, ROUTINE W REFLEX MICROSCOPIC
Bilirubin Urine: NEGATIVE
Glucose, UA: NEGATIVE mg/dL
Hgb urine dipstick: NEGATIVE
Ketones, ur: NEGATIVE mg/dL
Nitrite: NEGATIVE
Protein, ur: NEGATIVE mg/dL
Specific Gravity, Urine: 1.023 (ref 1.005–1.030)
Urobilinogen, UA: 0.2 mg/dL (ref 0.0–1.0)
pH: 6.5 (ref 5.0–8.0)

## 2014-03-19 LAB — CBC WITH DIFFERENTIAL/PLATELET
Basophils Absolute: 0 10*3/uL (ref 0.0–0.1)
Basophils Relative: 0 % (ref 0–1)
Eosinophils Absolute: 0.2 10*3/uL (ref 0.0–0.7)
Eosinophils Relative: 2 % (ref 0–5)
HCT: 32.4 % — ABNORMAL LOW (ref 36.0–46.0)
Hemoglobin: 10.5 g/dL — ABNORMAL LOW (ref 12.0–15.0)
Lymphocytes Relative: 27 % (ref 12–46)
Lymphs Abs: 2.1 10*3/uL (ref 0.7–4.0)
MCH: 26.8 pg (ref 26.0–34.0)
MCHC: 32.4 g/dL (ref 30.0–36.0)
MCV: 82.7 fL (ref 78.0–100.0)
Monocytes Absolute: 0.5 10*3/uL (ref 0.1–1.0)
Monocytes Relative: 6 % (ref 3–12)
Neutro Abs: 4.9 10*3/uL (ref 1.7–7.7)
Neutrophils Relative %: 64 % (ref 43–77)
Platelets: 273 10*3/uL (ref 150–400)
RBC: 3.92 MIL/uL (ref 3.87–5.11)
RDW: 16.5 % — ABNORMAL HIGH (ref 11.5–15.5)
WBC: 7.6 10*3/uL (ref 4.0–10.5)

## 2014-03-19 LAB — TROPONIN I: Troponin I: <0.3 ng/mL (ref <0.30)

## 2014-03-19 LAB — BASIC METABOLIC PANEL
BUN: 11 mg/dL (ref 6–23)
CO2: 25 mEq/L (ref 19–32)
Calcium: 8.7 mg/dL (ref 8.4–10.5)
Chloride: 100 mEq/L (ref 96–112)
Creatinine, Ser: 0.8 mg/dL (ref 0.50–1.10)
GFR calc Af Amer: >90 mL/min (ref >90)
GFR calc non Af Amer: 86 mL/min — ABNORMAL LOW (ref >90)
Glucose, Bld: 142 mg/dL — ABNORMAL HIGH (ref 70–99)
Potassium: 3.3 mEq/L — ABNORMAL LOW (ref 3.7–5.3)
Sodium: 141 mEq/L (ref 137–147)

## 2014-03-19 LAB — URINE MICROSCOPIC-ADD ON

## 2014-03-19 LAB — SEDIMENTATION RATE: Sed Rate: 49 mm/hr — ABNORMAL HIGH (ref 0–22)

## 2014-03-19 LAB — PREGNANCY, URINE: Preg Test, Ur: NEGATIVE

## 2014-03-19 NOTE — ED Notes (Signed)
Spoke with pt per registration request, pt requesting results of ekg.  Pulled pt back into room and pt asking what she is waiting for.  Informed that ekg was showed to md and there was nothing acute on ekg.  Pt stating that she had another episode of chest pain while waiting.  Informed will get pt back to a room as soon as possible.  Pt appears in nad.  Appears anxious over wait time.

## 2014-03-19 NOTE — ED Notes (Signed)
Examined by PA

## 2014-03-19 NOTE — ED Notes (Addendum)
Pt reports last night with red spot on right leg near ankle, has swelling and pitting edema in both legs.  Reports on feet all day yesterday.  Reports had pain in chest at other facility b/c of anxiety secondary to wait time but none currently.

## 2014-03-20 MED ORDER — PREDNISONE 20 MG PO TABS: 40.0000 mg | ORAL_TABLET | Freq: Every day | ORAL | Status: DC

## 2014-03-20 NOTE — Discharge Instructions (Signed)
Start taking the prednisone prescription as prescribed if your symptoms worsen.  Make sure to keep a close watch on your blood glucose levels while on this medicine as it can interfere with glucose control, as you are aware.  Take tylenol if needed for pain relief.  Elevation and warm compresses may also be helpful.  See your doctor for a recheck this week if not improving or for any worsened symptoms.

## 2014-03-21 NOTE — ED Provider Notes (Signed)
CSN: [id number]     Arrival date & time 03/19/14  1759 History   First MD Initiated Contact with Patient 03/19/14 2121     Chief Complaint  Patient presents with  . Leg Swelling     (Consider location/radiation/quality/duration/timing/severity/associated sxs/prior Treatment) HPI Comments: Bethanie Bloxom is a 48 y.o. Female with a history significant for fairly well controlled diabetes and htn,  Presenting with a one day history of red spots on her right ankle along with bilateral lower extremity swelling. She describes a small red circle on her right medial ankle which has now increased in size along with additional scattered red spots on the ankle and medial lower leg which is tender.  She also has tenderness of her left medial ankle as well.  She was seen at an outside facility and was sent here for further evaluation over concern of possible dvt.  She denies pain in her calfs or upper legs, no shortness of breath, but describes fleeting episodes of sharp chest pain, first at the urgent care prior to arrival here,  And again once while in our waiting room which she states is from the stress of wait time.  She is currently chest pain free.        The history is provided by the patient.    Past Medical History  Diagnosis Date  . Diabetes mellitus without complication   . Hypertension   . Cancer   . Herniated disc    Past Surgical History  Procedure Laterality Date  . Thyroidectomy     No family history on file. History  Substance Use Topics  . Smoking status: Never Smoker   . Smokeless tobacco: Not on file  . Alcohol Use: No   OB History   Grav Para Term Preterm Abortions TAB SAB Ect Mult Living                 Review of Systems  Constitutional: Negative for fever and chills.  HENT: Negative for congestion and sore throat.   Eyes: Negative.   Respiratory: Negative for cough, chest tightness and shortness of breath.   Cardiovascular: Positive for leg swelling. Negative for  palpitations.  Gastrointestinal: Negative for nausea and abdominal pain.  Genitourinary: Negative.   Musculoskeletal: Negative for arthralgias, joint swelling and neck pain.  Skin: Positive for color change and rash. Negative for wound.  Neurological: Negative for dizziness, weakness, light-headedness, numbness and headaches.  Psychiatric/Behavioral: Negative.       Allergies  Tetracyclines & related  Home Medications   Prior to Admission medications   Medication Sig Start Date End Date Taking? Authorizing Provider  amLODipine (NORVASC) 10 MG tablet Take 0.5 tablets (5 mg total) by mouth daily. 03/06/14   Silverio Decamp, MD  Calcium Carb-Cholecalciferol (CALCIUM 1000 + D PO) Take by mouth.    Historical Provider, MD  cephALEXin (KEFLEX) 500 MG capsule Take 1 capsule (500 mg total) by mouth 2 (two) times daily. 03/02/14   Silverio Decamp, MD  gabapentin (NEURONTIN) 600 MG tablet 1-2 tabs PO up to TID 12/20/13   Silverio Decamp, MD  Levonorgestrel-Ethinyl Estrad (ORSYTHIA PO) Take by mouth daily.    Historical Provider, MD  levothyroxine (SYNTHROID, LEVOTHROID) 150 MCG tablet Take 150 mcg by mouth daily before breakfast.    Historical Provider, MD  magnesium oxide (MAG-OX) 400 MG tablet Take 2 tablets (800 mg total) by mouth at bedtime. 01/29/14   Silverio Decamp, MD  meloxicam (MOBIC) 15 MG tablet One  tab PO qAM with breakfast for 2 weeks, then daily prn pain. 01/09/14   Silverio Decamp, MD  metFORMIN (GLUCOPHAGE-XR) 500 MG 24 hr tablet Take 500 mg by mouth daily with breakfast.    Historical Provider, MD  Misc Natural Products (GLUCOSAMINE CHONDROITIN VIT D3 PO) Take 1,200 mg by mouth 2 (two) times daily.    Historical Provider, MD  Multiple Vitamins-Minerals (WOMENS MULTIVITAMIN PLUS PO) Take by mouth daily.    Historical Provider, MD  Naltrexone-Bupropion HCl ER 8-90 MG TB12 Take 2 tablets by mouth 2 (two) times daily. 03/02/14   Silverio Decamp, MD   omeprazole (PRILOSEC) 40 MG capsule Take 1 capsule (40 mg total) by mouth daily. 10/23/13   Silverio Decamp, MD  ondansetron (ZOFRAN-ODT) 8 MG disintegrating tablet Using the mornings with morning Contrave Dose 03/07/14   Silverio Decamp, MD  phentermine (ADIPEX-P) 37.5 MG tablet One half tablet daily. 03/13/14   Silverio Decamp, MD  predniSONE (DELTASONE) 20 MG tablet Take 2 tablets (40 mg total) by mouth daily. 03/20/14   Evalee Jefferson, PA-C  sitaGLIPtin (JANUVIA) 100 MG tablet Take 100 mg by mouth daily.    Historical Provider, MD   BP 173/84  Pulse 92  Temp(Src) 98 F (36.7 C) (Oral)  Resp 20  Ht 5\' 6"  (1.676 m)  Wt 174 lb (78.926 kg)  BMI 28.10 kg/m2  SpO2 100% Physical Exam  Nursing note and vitals reviewed. Constitutional: Vital signs are normal. She appears well-developed and well-nourished.  Non-toxic appearance. She does not appear ill.  HENT:  Head: Normocephalic and atraumatic.  Eyes: Conjunctivae are normal.  Neck: Normal range of motion.  Cardiovascular: Normal rate, regular rhythm, normal heart sounds and intact distal pulses.   Pulmonary/Chest: Effort normal and breath sounds normal. No respiratory distress. She has no decreased breath sounds. She has no wheezes. She has no rhonchi. She has no rales.  Abdominal: Soft. Bowel sounds are normal. There is no tenderness.  Musculoskeletal: Normal range of motion.  Neurological: She is alert.  Skin: Skin is warm and dry. Rash noted.  Small patch of non blanching petechia on right medial ankle. Left medial ankle has faint patchy pink erythematous skin pattern without petechia.   Mild non pitting edema bilateral ankles.  Pedal pulses intact,  Less than 3 sec cap refill in toes.  No edema, pain, asymmetry of calves. No palpable cords.  Negative Homans sign.    Psychiatric: She has a normal mood and affect.    ED Course  Procedures (including critical care time) Labs Review Labs Reviewed  URINALYSIS, ROUTINE W  REFLEX MICROSCOPIC - Abnormal; Notable for the following:    APPearance CLOUDY (*)    Leukocytes, UA SMALL (*)    All other components within normal limits  URINE MICROSCOPIC-ADD ON - Abnormal; Notable for the following:    Bacteria, UA FEW (*)    All other components within normal limits  CBC WITH DIFFERENTIAL - Abnormal; Notable for the following:    Hemoglobin 10.5 (*)    HCT 32.4 (*)    RDW 16.5 (*)    All other components within normal limits  BASIC METABOLIC PANEL - Abnormal; Notable for the following:    Potassium 3.3 (*)    Glucose, Bld 142 (*)    GFR calc non Af Amer 86 (*)    All other components within normal limits  SEDIMENTATION RATE - Abnormal; Notable for the following:    Sed Rate 49 (*)  All other components within normal limits  PREGNANCY, URINE  TROPONIN I    Imaging Review No results found.   EKG Interpretation   Date/Time:  Monday Mar 19 2014 18:18:33 EDT Ventricular Rate:  94 PR Interval:  150 QRS Duration: 92 QT Interval:  372 QTC Calculation: 465 R Axis:   28 Text Interpretation:  Normal sinus rhythm Cannot rule out Anterior infarct  , age undetermined Abnormal ECG ED PHYSICIAN INTERPRETATION AVAILABLE IN  [facility] Confirmed by TEST, Record ([id number]) on 03/21/2014 6:54:36 AM      MDM   Final diagnoses:  Vasculitis of skin    Patients labs and/or radiological studies were viewed and considered during the medical decision making and disposition process.  Pt with history and exam suggesting vasculitis.  No evidence of dvt on exam.  Pt was seen by Dr. Lita Mains during this ed visit.  Pt agrees to taking low dose prednisone although was at first reluctant given DM.  Advised close watch on cbg's while on this medicine.  Also encouraged f/u with pcp for recheck this week.  Episodes of CP today were fleeting,  Not exertional,  Not concerning for cad.  Ekg completed in triage f/u with troponin which was drawn more than 6 hours after first  episode and was normal.    Evalee Jefferson, PA-C 03/21/14 1409

## 2014-03-22 ENCOUNTER — Ambulatory Visit: Payer: BC Managed Care – PPO | Admitting: Sports Medicine

## 2014-03-22 ENCOUNTER — Telehealth: Payer: Self-pay

## 2014-03-22 NOTE — ED Provider Notes (Signed)
Medical screening examination/treatment/procedure(s) were performed by non-physician practitioner and as supervising physician I was immediately available for consultation/collaboration.   EKG Interpretation   Date/Time:  Monday Mar 19 2014 18:18:33 EDT Ventricular Rate:  94 PR Interval:  150 QRS Duration: 92 QT Interval:  372 QTC Calculation: 465 R Axis:   28 Text Interpretation:  Normal sinus rhythm Cannot rule out Anterior infarct  , age undetermined Abnormal ECG ED PHYSICIAN INTERPRETATION AVAILABLE IN  [facility] Confirmed by TEST, Record ([id number]) on 03/21/2014 6:54:36 AM        Julianne Rice, MD 03/22/14 [phone]

## 2014-03-22 NOTE — Telephone Encounter (Signed)
Patient called stated that she was seen in the ER for a blood clot she stated that it was caused from the blood pressure medication, she stated that she has stopped taking it and she wants to know if you could change her BP medication to something else. Rhonda Cunningham,CMA

## 2014-03-22 NOTE — Telephone Encounter (Signed)
I need to actually see her in the office if she had a blood clot, before we switch around medications. Also, blood pressure medications don't cause blood clots.

## 2014-03-23 NOTE — Telephone Encounter (Signed)
Left a message for patient to call back to schedule a hospital follow up.

## 2014-04-13 ENCOUNTER — Encounter: Payer: Self-pay | Admitting: Sports Medicine

## 2014-04-13 ENCOUNTER — Ambulatory Visit (INDEPENDENT_AMBULATORY_CARE_PROVIDER_SITE_OTHER): Payer: BC Managed Care – PPO | Admitting: Sports Medicine

## 2014-04-13 VITALS — BP 140/82 | Wt 170.0 lb

## 2014-04-13 DIAGNOSIS — E119 Type 2 diabetes mellitus without complications: Secondary | ICD-10-CM

## 2014-04-13 DIAGNOSIS — E669 Obesity, unspecified: Secondary | ICD-10-CM

## 2014-04-13 DIAGNOSIS — I10 Essential (primary) hypertension: Secondary | ICD-10-CM

## 2014-04-13 DIAGNOSIS — M722 Plantar fascial fibromatosis: Secondary | ICD-10-CM

## 2014-04-13 MED ORDER — PHENTERMINE HCL 37.5 MG PO TABS: ORAL_TABLET | ORAL | Status: DC

## 2014-04-13 MED ORDER — AMLODIPINE BESYLATE 2.5 MG PO TABS: 2.5000 mg | ORAL_TABLET | Freq: Every day | ORAL | Status: DC

## 2014-04-13 MED ORDER — LORCASERIN HCL 10 MG PO TABS: 1.0000 | ORAL_TABLET | Freq: Two times a day (BID) | ORAL | Status: DC

## 2014-04-13 NOTE — Progress Notes (Signed)
    Patient was fitted for a : standard, cushioned, semi-rigid orthotic. The orthotic was heated and afterward the patient stood on the orthotic blank positioned on the orthotic stand. The patient was positioned in subtalar neutral position and 10 degrees of ankle dorsiflexion in a weight bearing stance. After completion of molding, a stable base was applied to the orthotic blank. The blank was ground to a stable position for weight bearing. Size: 8 Base: Blue EVA Additional Posting and Padding: None The patient ambulated these, and they were very comfortable.  I spent 40 minutes with this patient, greater than 50% was face-to-face time counseling regarding the below diagnosis.  We spent at least an additional 30 minutes face to face with this patient in addition to the above noted time going over multiple medical problems as noted below.

## 2014-04-13 NOTE — Assessment & Plan Note (Signed)
Per endocrinologist hemoglobin A1c's have been in the low sixes. She has lost a considerable amount of weight. Like her to now discontinue Januvia, continue metformin. Return to see me in a month.

## 2014-04-13 NOTE — Assessment & Plan Note (Signed)
Continue phentermine, she is breaking in half to 37.5 mg tablets. Untoward side effects with Contrave. Switching to Belviq.

## 2014-04-13 NOTE — Assessment & Plan Note (Signed)
Made her a second set of custom orthotics today.

## 2014-04-13 NOTE — Assessment & Plan Note (Signed)
Amlodipine 10 was too high. Even breaking it in half was too big of a dose. Switching to amlodipine 2.5 mg.

## 2014-04-27 ENCOUNTER — Ambulatory Visit (INDEPENDENT_AMBULATORY_CARE_PROVIDER_SITE_OTHER): Payer: BC Managed Care – PPO | Admitting: Sports Medicine

## 2014-04-27 ENCOUNTER — Encounter: Payer: Self-pay | Admitting: Sports Medicine

## 2014-04-27 VITALS — BP 144/88 | HR 92 | Ht 66.0 in | Wt 170.0 lb

## 2014-04-27 DIAGNOSIS — M722 Plantar fascial fibromatosis: Secondary | ICD-10-CM

## 2014-04-27 NOTE — Progress Notes (Signed)
  Subjective:    CC: Followup  HPI: Plantar fasciitis: Bilateral, last injection was 7 months ago. Symptoms are worsening, localized the calcaneal insertion of the plantar fascia and worse in the mornings.  Past medical history, Surgical history, Family history not pertinant except as noted below, Social history, Allergies, and medications have been entered into the medical record, reviewed, and no changes needed.   Review of Systems: No fevers, chills, night sweats, weight loss, chest pain, or shortness of breath.   Objective:    General: Well Developed, well nourished, and in no acute distress.  Neuro: Alert and oriented x3, extra-ocular muscles intact, sensation grossly intact.  HEENT: Normocephalic, atraumatic, pupils equal round reactive to light, neck supple, no masses, no lymphadenopathy, thyroid nonpalpable.  Skin: Warm and dry, no rashes. Cardiac: Regular rate and rhythm, no murmurs rubs or gallops, no lower extremity edema.  Respiratory: Clear to auscultation bilaterally. Not using accessory muscles, speaking in full sentences. Bilateral feet: No visible erythema or swelling. Range of motion is full in all directions. Strength is 5/5 in all directions. No hallux valgus. No pes cavus or pes planus. No abnormal callus noted. No pain over the navicular prominence, or base of fifth metatarsal. Tender to palpation of the calcaneal insertion of plantar fascia. No pain at the Achilles insertion. No pain over the calcaneal bursa. No pain of the retrocalcaneal bursa. No tenderness to palpation over the tarsals, metatarsals, or phalanges. No hallux rigidus or limitus. No tenderness palpation over interphalangeal joints. No pain with compression of the metatarsal heads. Neurovascularly intact distally.  Procedure: Real-time Ultrasound Guided Injection of left plantar fascia Device: GE Logiq E  Verbal informed consent obtained.  Time-out conducted.  Noted no overlying erythema,  induration, or other signs of local infection.  Skin prepped in a sterile fashion.  Local anesthesia: Topical Ethyl chloride.  With sterile technique and under real time ultrasound guidance:  25-gauge needle advanced to the calcaneal origin of the plantar fascia, 1 cc Kenalog 40, 3 cc lidocaine injected easily. Completed without difficulty  Pain immediately resolved suggesting accurate placement of the medication.  Advised to call if fevers/chills, erythema, induration, drainage, or persistent bleeding.  Images permanently stored and available for review in the ultrasound unit.  Impression: Technically successful ultrasound guided injection.  Procedure: Real-time Ultrasound Guided Injection of right plantar fascia Device: GE Logiq E  Verbal informed consent obtained.  Time-out conducted.  Noted no overlying erythema, induration, or other signs of local infection.  Skin prepped in a sterile fashion.  Local anesthesia: Topical Ethyl chloride.  With sterile technique and under real time ultrasound guidance:  25-gauge needle advanced to the calcaneal origin of the plantar fascia, 1 cc Kenalog 40, 3 cc lidocaine injected easily. Completed without difficulty  Pain immediately resolved suggesting accurate placement of the medication.  Advised to call if fevers/chills, erythema, induration, drainage, or persistent bleeding.  Images permanently stored and available for review in the ultrasound unit.  Impression: Technically successful ultrasound guided injection.  Impression and Recommendations:

## 2014-04-27 NOTE — Assessment & Plan Note (Signed)
Last injection was 7 months ago. Repeat injection today bilaterally. Air heel brace. Return as needed.

## 2014-05-16 ENCOUNTER — Encounter: Payer: Self-pay | Admitting: Sports Medicine

## 2014-05-16 ENCOUNTER — Ambulatory Visit (INDEPENDENT_AMBULATORY_CARE_PROVIDER_SITE_OTHER): Payer: BC Managed Care – PPO | Admitting: Sports Medicine

## 2014-05-16 VITALS — BP 142/86 | HR 93 | Ht 66.0 in | Wt 166.0 lb

## 2014-05-16 DIAGNOSIS — E669 Obesity, unspecified: Secondary | ICD-10-CM

## 2014-05-16 DIAGNOSIS — M722 Plantar fascial fibromatosis: Secondary | ICD-10-CM

## 2014-05-16 MED ORDER — PHENTERMINE HCL 37.5 MG PO TABS: ORAL_TABLET | ORAL | Status: DC

## 2014-05-16 NOTE — Progress Notes (Signed)
  Subjective:    CC: Followup  HPI: Plantar fasciitis : was initially doing well after her recent injection, she then went running barefoot on the beach, and had immediate and worsening pain that she localized along the right medial band of the plantar fascia, she did feel a pop. No swelling, no bruising per patient. Pain is now moderate, persistent.   Obesity: Needs a refill on phentermine.  Past medical history, Surgical history, Family history not pertinant except as noted below, Social history, Allergies, and medications have been entered into the medical record, reviewed, and no changes needed.   Review of Systems: No fevers, chills, night sweats, weight loss, chest pain, or shortness of breath.   Objective:    General: Well Developed, well nourished, and in no acute distress.  Neuro: Alert and oriented x3, extra-ocular muscles intact, sensation grossly intact.  HEENT: Normocephalic, atraumatic, pupils equal round reactive to light, neck supple, no masses, no lymphadenopathy, thyroid nonpalpable.  Skin: Warm and dry, no rashes. Cardiac: Regular rate and rhythm, no murmurs rubs or gallops, no lower extremity edema.  Respiratory: Clear to auscultation bilaterally. Not using accessory muscles, speaking in full sentences. Right Foot: No visible erythema or swelling. Range of motion is full in all directions. Strength is 5/5 in all directions. No hallux valgus. No pes cavus or pes planus. No abnormal callus noted. No pain over the navicular prominence, or base of fifth metatarsal. No tenderness to palpation of the calcaneal insertion of plantar fascia. She does have some tenderness along the medial band of the plantar fascia in the midfoot. No pain at the Achilles insertion. No pain over the calcaneal bursa. No pain of the retrocalcaneal bursa. No tenderness to palpation over the tarsals, metatarsals, or phalanges. No hallux rigidus or limitus. No tenderness palpation over  interphalangeal joints. No pain with compression of the metatarsal heads. Neurovascularly intact distally.  Impression and Recommendations:

## 2014-05-16 NOTE — Assessment & Plan Note (Addendum)
After running on the beach barefoot it sounds like she has a partial split in her plantar fascia of the right foot. She has a cam boot at home, she will wear this for the next 2 weeks. Return to see me in 3 weeks, MRI for further evaluation if no better.

## 2014-05-16 NOTE — Assessment & Plan Note (Signed)
Continue 18.75 mg phentermine.

## 2014-05-25 ENCOUNTER — Other Ambulatory Visit: Payer: Self-pay | Admitting: *Deleted

## 2014-05-25 DIAGNOSIS — I10 Essential (primary) hypertension: Secondary | ICD-10-CM

## 2014-05-25 MED ORDER — AMLODIPINE BESYLATE 2.5 MG PO TABS: 2.5000 mg | ORAL_TABLET | Freq: Every day | ORAL | Status: DC

## 2014-05-25 NOTE — Telephone Encounter (Signed)
90 day refill amlodipine sent to [facility]. Margette Fast, CMA

## 2014-06-12 ENCOUNTER — Ambulatory Visit: Payer: BC Managed Care – PPO | Admitting: Sports Medicine

## 2014-06-15 ENCOUNTER — Encounter: Payer: Self-pay | Admitting: Sports Medicine

## 2014-06-15 ENCOUNTER — Ambulatory Visit (INDEPENDENT_AMBULATORY_CARE_PROVIDER_SITE_OTHER): Payer: BC Managed Care – PPO | Admitting: Sports Medicine

## 2014-06-15 VITALS — BP 144/88 | HR 86 | Ht 66.0 in | Wt 165.0 lb

## 2014-06-15 DIAGNOSIS — M5416 Radiculopathy, lumbar region: Secondary | ICD-10-CM

## 2014-06-15 DIAGNOSIS — M722 Plantar fascial fibromatosis: Secondary | ICD-10-CM

## 2014-06-15 DIAGNOSIS — E669 Obesity, unspecified: Secondary | ICD-10-CM

## 2014-06-15 DIAGNOSIS — IMO0002 Reserved for concepts with insufficient information to code with codable children: Secondary | ICD-10-CM

## 2014-06-15 MED ORDER — PHENTERMINE HCL 37.5 MG PO TABS: ORAL_TABLET | ORAL | Status: DC

## 2014-06-15 NOTE — Progress Notes (Signed)
  Subjective:    CC: Followup  HPI: Right plantar fasciitis: Nancy Poole was doing very well after an injection until she went running on the beach, and had immediate pain in the lateral bands of the plantar fascia, I placed her at [place], and she returns feeling significantly better. Unfortunately she continues to have numbness that she localizes on the plantar aspect of her foot, moderate, persistent with mild back pain.  Past medical history, Surgical history, Family history not pertinant except as noted below, Social history, Allergies, and medications have been entered into the medical record, reviewed, and no changes needed.   Review of Systems: No fevers, chills, night sweats, weight loss, chest pain, or shortness of breath.   Objective:    General: Well Developed, well nourished, and in no acute distress.  Neuro: Alert and oriented x3, extra-ocular muscles intact, sensation grossly intact.  HEENT: Normocephalic, atraumatic, pupils equal round reactive to light, neck supple, no masses, no lymphadenopathy, thyroid nonpalpable.  Skin: Warm and dry, no rashes. Cardiac: Regular rate and rhythm, no murmurs rubs or gallops, no lower extremity edema.  Respiratory: Clear to auscultation bilaterally. Not using accessory muscles, speaking in full sentences. Right Foot: No visible erythema or swelling. Range of motion is full in all directions. Strength is 5/5 in all directions. No hallux valgus. Bilateral pes cavus No abnormal callus noted. No pain over the navicular prominence, or base of fifth metatarsal. No tenderness to palpation of the calcaneal insertion of plantar fascia. No pain at the Achilles insertion. No pain over the calcaneal bursa. No pain of the retrocalcaneal bursa. No tenderness to palpation over the tarsals, metatarsals, or phalanges. No hallux rigidus or limitus. No tenderness palpation over interphalangeal joints. No pain with compression of the metatarsal  heads. Neurovascularly intact distally.  Impression and Recommendations:

## 2014-06-15 NOTE — Assessment & Plan Note (Signed)
She is having some difficulty voiding, post void residuals were normal with the urologist. There is a wide differential here including cauda equina syndrome from her large L4-L5 disc protrusion, we're going to proceed with another epidural on the right side at L4-5. It is also possible that the gabapentin and phentermine may be contributory, she will stop the gabapentin, if no improvement she will try coming off the phentermine. Return to see me 2 weeks after the epidural.

## 2014-06-15 NOTE — Assessment & Plan Note (Signed)
Discontinue cam boot.

## 2014-06-15 NOTE — Assessment & Plan Note (Signed)
Refilling phentermine.

## 2014-06-25 ENCOUNTER — Ambulatory Visit
Admission: RE | Admit: 2014-06-25 | Discharge: 2014-06-25 | Disposition: A | Discharge status: Home / Self Care | Payer: BC Managed Care – PPO | Source: Ambulatory Visit | Attending: Sports Medicine | Admitting: Sports Medicine

## 2014-06-25 ENCOUNTER — Other Ambulatory Visit: Payer: BC Managed Care – PPO

## 2014-06-25 VITALS — BP 160/91 | HR 90

## 2014-06-25 DIAGNOSIS — M5416 Radiculopathy, lumbar region: Secondary | ICD-10-CM

## 2014-06-25 MED ORDER — IOHEXOL 180 MG/ML  SOLN
1.0000 mL | Freq: Once | INTRAMUSCULAR | Status: AC | PRN
  Administered 2014-06-25: 1 mL via EPIDURAL

## 2014-06-25 MED ORDER — METHYLPREDNISOLONE ACETATE 40 MG/ML INJ SUSP (RADIOLOG
120.0000 mg | Freq: Once | INTRAMUSCULAR | Status: AC
  Administered 2014-06-25: 120 mg via EPIDURAL

## 2014-06-25 NOTE — Discharge Instructions (Signed)

## 2014-07-17 ENCOUNTER — Other Ambulatory Visit: Payer: Self-pay | Admitting: Sports Medicine

## 2014-07-19 ENCOUNTER — Telehealth: Payer: Self-pay

## 2014-07-19 DIAGNOSIS — E034 Atrophy of thyroid (acquired): Secondary | ICD-10-CM

## 2014-07-19 DIAGNOSIS — E119 Type 2 diabetes mellitus without complications: Secondary | ICD-10-CM

## 2014-07-19 NOTE — Telephone Encounter (Signed)
Orders placed.

## 2014-07-19 NOTE — Telephone Encounter (Signed)
Patient called stated that you would take over her thyroid issues so she  requested an order for Thyroid panel and A1C so that she could get those done and the results will be available for her appt. Rhonda Cunningham,CMA

## 2014-07-19 NOTE — Telephone Encounter (Signed)
Left message on patient mvm advising her that lab were ordered.Suanne Marker Cunningham,CMA

## 2014-07-24 ENCOUNTER — Telehealth: Payer: Self-pay

## 2014-07-24 ENCOUNTER — Ambulatory Visit (INDEPENDENT_AMBULATORY_CARE_PROVIDER_SITE_OTHER): Payer: BC Managed Care – PPO | Admitting: Sports Medicine

## 2014-07-24 ENCOUNTER — Encounter: Payer: Self-pay | Admitting: Sports Medicine

## 2014-07-24 VITALS — BP 135/84 | HR 91 | Ht 66.0 in | Wt 162.0 lb

## 2014-07-24 DIAGNOSIS — E0789 Other specified disorders of thyroid: Secondary | ICD-10-CM

## 2014-07-24 DIAGNOSIS — E038 Other specified hypothyroidism: Secondary | ICD-10-CM

## 2014-07-24 DIAGNOSIS — G4700 Insomnia, unspecified: Secondary | ICD-10-CM

## 2014-07-24 DIAGNOSIS — IMO0002 Reserved for concepts with insufficient information to code with codable children: Secondary | ICD-10-CM

## 2014-07-24 DIAGNOSIS — Z23 Encounter for immunization: Secondary | ICD-10-CM

## 2014-07-24 DIAGNOSIS — E039 Hypothyroidism, unspecified: Secondary | ICD-10-CM | POA: Insufficient documentation

## 2014-07-24 DIAGNOSIS — M5416 Radiculopathy, lumbar region: Secondary | ICD-10-CM

## 2014-07-24 DIAGNOSIS — E034 Atrophy of thyroid (acquired): Secondary | ICD-10-CM

## 2014-07-24 DIAGNOSIS — E119 Type 2 diabetes mellitus without complications: Secondary | ICD-10-CM

## 2014-07-24 LAB — T4, FREE: Free T4: 1.66 ng/dL (ref 0.80–1.80)

## 2014-07-24 LAB — T3, FREE: T3, Free: 3.1 pg/mL (ref 2.3–4.2)

## 2014-07-24 LAB — HEMOGLOBIN A1C
Hgb A1c MFr Bld: 6.5 % — ABNORMAL HIGH (ref <5.7)
Mean Plasma Glucose: 140 mg/dL — ABNORMAL HIGH (ref <117)

## 2014-07-24 LAB — TSH: TSH: 0.553 u[IU]/mL (ref 0.350–4.500)

## 2014-07-24 MED ORDER — LEVOTHYROXINE SODIUM 150 MCG PO TABS: 150.0000 ug | ORAL_TABLET | Freq: Every day | ORAL | Status: DC

## 2014-07-24 MED ORDER — TRAZODONE HCL 50 MG PO TABS: 50.0000 mg | ORAL_TABLET | Freq: Every day | ORAL | Status: DC

## 2014-07-24 NOTE — Assessment & Plan Note (Signed)
Checking urine microalbumin, awaiting blood work. Referral to ophthalmology, foot exam normal, pneumococcal vaccine given today.

## 2014-07-24 NOTE — Assessment & Plan Note (Signed)
Awaiting TSH. Refilling with 15 days of medication.

## 2014-07-24 NOTE — Assessment & Plan Note (Signed)
Slightly improved right-sided radicular symptoms.  Voiding has improved coming off of gabapentin.

## 2014-07-24 NOTE — Telephone Encounter (Signed)
She told me that he gave her an intractable dry mouth, so we did not refill it.

## 2014-07-24 NOTE — Telephone Encounter (Signed)
Patient was seen in office today and she forgot to ask for refill on Phentermine. Patient request a refill. Rhonda Cunningham,CMA

## 2014-07-24 NOTE — Progress Notes (Signed)
  Subjective:    CC: Followup  HPI: Insomnia: Insufficient response to over-the-counter antihistamines.  Hypothyroidism: Due for TSH recheck.  Diabetes mellitus type 2: Delinquent on some preventative measures, currently on metformin.  Past medical history, Surgical history, Family history not pertinant except as noted below, Social history, Allergies, and medications have been entered into the medical record, reviewed, and no changes needed.   Review of Systems: No fevers, chills, night sweats, weight loss, chest pain, or shortness of breath.   Objective:    General: Well Developed, well nourished, and in no acute distress.  Neuro: Alert and oriented x3, extra-ocular muscles intact, sensation grossly intact.  HEENT: Normocephalic, atraumatic, pupils equal round reactive to light, neck supple, no masses, no lymphadenopathy, thyroid nonpalpable.  Skin: Warm and dry, no rashes. Cardiac: Regular rate and rhythm, no murmurs rubs or gallops, no lower extremity edema.  Respiratory: Clear to auscultation bilaterally. Not using accessory muscles, speaking in full sentences.  Impression and Recommendations:

## 2014-07-24 NOTE — Assessment & Plan Note (Signed)
Starting trazodone.

## 2014-07-25 ENCOUNTER — Telehealth: Payer: Self-pay | Admitting: *Deleted

## 2014-07-25 LAB — MICROALBUMIN / CREATININE URINE RATIO
Creatinine, Urine: 431.5 mg/dL
Microalb Creat Ratio: 5.5 mg/g (ref 0.0–30.0)
Microalb, Ur: 2.36 mg/dL — ABNORMAL HIGH (ref 0.00–1.89)

## 2014-07-25 MED ORDER — PHENTERMINE HCL 37.5 MG PO TABS: ORAL_TABLET | ORAL | Status: DC

## 2014-07-25 NOTE — Telephone Encounter (Signed)
Definitely her, doesn't matter, we will ignore the dry mouth then. Rx in box.

## 2014-07-25 NOTE — Telephone Encounter (Signed)
Nancy Poole called that since receiving the flu, pneumonia and tetanus yesterday she is experiencing body aches, chills, congestion and sweats. I told her that this was a common side effect after injection but to increase  Her fluid intake and stay hydrated. She was able to use tylenol for her headache or ibuprofen which ever one worked better for her. Please advise. Margette Fast, CMA

## 2014-07-25 NOTE — Telephone Encounter (Signed)
Sounds good, just ignore all symptoms and use ibuprofen and Tylenol together if desired. They will resolve.

## 2014-07-25 NOTE — Telephone Encounter (Signed)
I explained in detail with Nancy Poole regarding side effects but she is concerned that the same thing happened with the swollen lymph nodes and shortly after her cancer was discovered. I assured her that this was from her immunizations but if she felt this way next week to make an appt to be seen. Margette Fast, CMA

## 2014-07-25 NOTE — Telephone Encounter (Signed)
Spoke to patient she stated that it was never discussed must be confusing her with another patient but she would like a refill on Phentermine. Rhonda Cunningham,CMA

## 2014-07-25 NOTE — Telephone Encounter (Signed)
Patient has been informed. Rhonda Cunningham,CMA  

## 2014-07-31 DIAGNOSIS — R339 Retention of urine, unspecified: Secondary | ICD-10-CM | POA: Insufficient documentation

## 2014-07-31 DIAGNOSIS — R319 Hematuria, unspecified: Secondary | ICD-10-CM | POA: Insufficient documentation

## 2014-08-20 ENCOUNTER — Other Ambulatory Visit: Payer: Self-pay | Admitting: Sports Medicine

## 2014-08-20 ENCOUNTER — Other Ambulatory Visit: Payer: Self-pay | Admitting: *Deleted

## 2014-08-20 MED ORDER — METFORMIN HCL ER 500 MG PO TB24: 500.0000 mg | ORAL_TABLET | Freq: Every day | ORAL | Status: DC

## 2014-08-20 MED ORDER — LEVOTHYROXINE SODIUM 150 MCG PO TABS: ORAL_TABLET | ORAL | Status: DC

## 2014-08-21 ENCOUNTER — Ambulatory Visit: Payer: BC Managed Care – PPO | Admitting: Sports Medicine

## 2014-09-11 ENCOUNTER — Ambulatory Visit (INDEPENDENT_AMBULATORY_CARE_PROVIDER_SITE_OTHER): Payer: BC Managed Care – PPO | Admitting: Sports Medicine

## 2014-09-11 ENCOUNTER — Encounter: Payer: Self-pay | Admitting: Sports Medicine

## 2014-09-11 VITALS — BP 147/79 | HR 84 | Ht 66.0 in | Wt 164.0 lb

## 2014-09-11 DIAGNOSIS — M5417 Radiculopathy, lumbosacral region: Secondary | ICD-10-CM

## 2014-09-11 DIAGNOSIS — E669 Obesity, unspecified: Secondary | ICD-10-CM | POA: Diagnosis not present

## 2014-09-11 DIAGNOSIS — M5416 Radiculopathy, lumbar region: Secondary | ICD-10-CM

## 2014-09-11 DIAGNOSIS — G4700 Insomnia, unspecified: Secondary | ICD-10-CM | POA: Diagnosis not present

## 2014-09-11 MED ORDER — NALTREXONE-BUPROPION HCL ER 8-90 MG PO TB12
ORAL_TABLET | ORAL | Status: DC
Start: 2014-09-11 — End: 2014-10-23

## 2014-09-11 MED ORDER — PHENTERMINE HCL 37.5 MG PO TABS: ORAL_TABLET | ORAL | Status: DC

## 2014-09-11 NOTE — Progress Notes (Signed)
  Subjective:    CC: follow-up  HPI: Obesity: Needs a refill on Contrave and phentermine.  Right lumbar radiculopathy: History of an L4-L5 disc protrusion, post 2 epidurals, the first of which was effective, the second of which was not. Currently being set up for a new MRI at the [facility] spine center.she did come off her gabapentin, is noting increasing pain.  Insomnia: Amenable to switching to gabapentin, trazodone was ineffective, made her too drowsy in the morning even at low dose.  Past medical history, Surgical history, Family history not pertinant except as noted below, Social history, Allergies, and medications have been entered into the medical record, reviewed, and no changes needed.   Review of Systems: No fevers, chills, night sweats, weight loss, chest pain, or shortness of breath.   Objective:    General: Well Developed, well nourished, and in no acute distress.  Neuro: Alert and oriented x3, extra-ocular muscles intact, sensation grossly intact.  HEENT: Normocephalic, atraumatic, pupils equal round reactive to light, neck supple, no masses, no lymphadenopathy, thyroid nonpalpable.  Skin: Warm and dry, no rashes. Cardiac: Regular rate and rhythm, no murmurs rubs or gallops, no lower extremity edema.  Respiratory: Clear to auscultation bilaterally. Not using accessory muscles, speaking in full sentences.  Impression and Recommendations:

## 2014-09-11 NOTE — Assessment & Plan Note (Signed)
Recently got a new MRI at the [facility] spine center. Initial epidural was successful, subsequent epidural was not. She is a candidate for her third epidural. She will let me know.

## 2014-09-11 NOTE — Assessment & Plan Note (Signed)
Refilling contrave and phentermine.

## 2014-09-11 NOTE — Assessment & Plan Note (Signed)
Going to try low-dose gabapentin. Discontinue trazodone, made her too drowsy

## 2014-09-26 ENCOUNTER — Telehealth: Payer: Self-pay

## 2014-09-26 DIAGNOSIS — M5416 Radiculopathy, lumbar region: Secondary | ICD-10-CM

## 2014-09-26 NOTE — Telephone Encounter (Signed)
Ordered

## 2014-09-26 NOTE — Telephone Encounter (Signed)
Left message on [facility] vm to call and schedule patient. Rhonda Cunningham,CMA

## 2014-09-26 NOTE — Telephone Encounter (Signed)
Patient called stated that she would like to have another Epidural done with Dr. Olen Cordial. Rhonda Cunningham,CMA

## 2014-09-29 ENCOUNTER — Other Ambulatory Visit: Payer: Self-pay | Admitting: Sports Medicine

## 2014-10-23 ENCOUNTER — Encounter: Payer: Self-pay | Admitting: Sports Medicine

## 2014-10-23 ENCOUNTER — Ambulatory Visit (INDEPENDENT_AMBULATORY_CARE_PROVIDER_SITE_OTHER): Payer: BC Managed Care – PPO | Admitting: Sports Medicine

## 2014-10-23 VITALS — BP 151/89 | HR 89 | Ht 66.0 in | Wt 164.0 lb

## 2014-10-23 DIAGNOSIS — M5417 Radiculopathy, lumbosacral region: Secondary | ICD-10-CM | POA: Diagnosis not present

## 2014-10-23 DIAGNOSIS — E669 Obesity, unspecified: Secondary | ICD-10-CM

## 2014-10-23 DIAGNOSIS — G4700 Insomnia, unspecified: Secondary | ICD-10-CM

## 2014-10-23 DIAGNOSIS — I10 Essential (primary) hypertension: Secondary | ICD-10-CM | POA: Diagnosis not present

## 2014-10-23 DIAGNOSIS — M5416 Radiculopathy, lumbar region: Secondary | ICD-10-CM

## 2014-10-23 MED ORDER — ZOLPIDEM TARTRATE 5 MG PO TABS: 5.0000 mg | ORAL_TABLET | Freq: Every evening | ORAL | Status: DC | PRN

## 2014-10-23 MED ORDER — AMLODIPINE BESYLATE 5 MG PO TABS: 5.0000 mg | ORAL_TABLET | Freq: Every day | ORAL | Status: DC

## 2014-10-23 MED ORDER — NALTREXONE-BUPROPION HCL ER 8-90 MG PO TB12: ORAL_TABLET | ORAL | Status: DC

## 2014-10-23 NOTE — Progress Notes (Signed)
  Subjective:    CC: Follow-up  HPI: Insomnia: Insufficient response to gabapentin, did not tolerate trazodone.  Obesity: Has not lost any weight on phentermine and Contrave together.  Lumbar degenerative disc disease: She does have her epidural scheduled now.  Past medical history, Surgical history, Family history not pertinant except as noted below, Social history, Allergies, and medications have been entered into the medical record, reviewed, and no changes needed.   Review of Systems: No fevers, chills, night sweats, weight loss, chest pain, or shortness of breath.   Objective:    General: Well Developed, well nourished, and in no acute distress.  Neuro: Alert and oriented x3, extra-ocular muscles intact, sensation grossly intact.  HEENT: Normocephalic, atraumatic, pupils equal round reactive to light, neck supple, no masses, no lymphadenopathy, thyroid nonpalpable.  Skin: Warm and dry, no rashes. Cardiac: Regular rate and rhythm, no murmurs rubs or gallops, no lower extremity edema.  Respiratory: Clear to auscultation bilaterally. Not using accessory muscles, speaking in full sentences.  Impression and Recommendations:

## 2014-10-23 NOTE — Assessment & Plan Note (Signed)
Has lumbar epidural coming up tomorrow.

## 2014-10-23 NOTE — Assessment & Plan Note (Signed)
Elevated, increasing amlodipine to 5 mg daily.

## 2014-10-23 NOTE — Assessment & Plan Note (Signed)
No weight loss, discontinuing phentermine. She may continue Contrave.

## 2014-10-23 NOTE — Assessment & Plan Note (Signed)
No response to trazodone, insufficient response to gabapentin. Switching to 5 mg Ambien.

## 2014-10-24 ENCOUNTER — Ambulatory Visit
Admission: RE | Admit: 2014-10-24 | Discharge: 2014-10-24 | Disposition: A | Discharge status: Home / Self Care | Payer: BC Managed Care – PPO | Source: Ambulatory Visit | Attending: Sports Medicine | Admitting: Sports Medicine

## 2014-10-24 MED ORDER — IOHEXOL 180 MG/ML  SOLN: 1.0000 mL | Freq: Once | INTRAMUSCULAR | Status: AC | PRN

## 2014-10-24 MED ORDER — METHYLPREDNISOLONE ACETATE 40 MG/ML INJ SUSP (RADIOLOG: 120.0000 mg | Freq: Once | INTRAMUSCULAR | Status: DC

## 2014-10-24 NOTE — Discharge Instructions (Signed)

## 2014-10-25 ENCOUNTER — Telehealth: Payer: Self-pay

## 2014-10-25 DIAGNOSIS — M5136 Other intervertebral disc degeneration, lumbar region: Secondary | ICD-10-CM

## 2014-10-25 DIAGNOSIS — M51369 Other intervertebral disc degeneration, lumbar region without mention of lumbar back pain or lower extremity pain: Secondary | ICD-10-CM

## 2014-10-25 NOTE — Telephone Encounter (Signed)
Patient called stated that she had her spinal injection done and the Dr. suggested that she have a 2nd one done in 2 weeks to get the full effect. Patient would like that order put in. Rhonda Cunningham,CMA

## 2014-10-25 NOTE — Telephone Encounter (Signed)
Orders placed for a repeat epidural.

## 2014-10-25 NOTE — Telephone Encounter (Signed)
Left message on [facility] vm to call and schedule the patient. Rhonda Cunningham,CMA

## 2014-10-29 ENCOUNTER — Telehealth: Payer: Self-pay

## 2014-10-29 NOTE — Telephone Encounter (Signed)
Left a message on [facility] (GI) to call and schedule patient for epidural. Oneta Rack

## 2014-10-29 NOTE — Telephone Encounter (Signed)
Patient called and states she will need another epidural injections in two weeks.

## 2014-11-06 ENCOUNTER — Ambulatory Visit
Admission: RE | Admit: 2014-11-06 | Discharge: 2014-11-06 | Disposition: A | Discharge status: Home / Self Care | Payer: BC Managed Care – PPO | Source: Ambulatory Visit | Attending: Sports Medicine | Admitting: Sports Medicine

## 2014-11-06 DIAGNOSIS — M5417 Radiculopathy, lumbosacral region: Principal | ICD-10-CM

## 2014-11-06 DIAGNOSIS — M5414 Radiculopathy, thoracic region: Secondary | ICD-10-CM

## 2014-11-06 MED ORDER — IOHEXOL 180 MG/ML  SOLN
1.0000 mL | Freq: Once | INTRAMUSCULAR | Status: AC | PRN
  Administered 2014-11-06: 1 mL via EPIDURAL

## 2014-11-06 MED ORDER — METHYLPREDNISOLONE ACETATE 40 MG/ML INJ SUSP (RADIOLOG
120.0000 mg | Freq: Once | INTRAMUSCULAR | Status: AC
  Administered 2014-11-06: 120 mg via EPIDURAL

## 2014-12-04 ENCOUNTER — Encounter: Payer: Self-pay | Admitting: Sports Medicine

## 2014-12-04 ENCOUNTER — Ambulatory Visit (INDEPENDENT_AMBULATORY_CARE_PROVIDER_SITE_OTHER): Payer: BLUE CROSS/BLUE SHIELD | Admitting: Sports Medicine

## 2014-12-04 VITALS — BP 132/73 | HR 90 | Ht 66.0 in | Wt 166.0 lb

## 2014-12-04 DIAGNOSIS — M5416 Radiculopathy, lumbar region: Secondary | ICD-10-CM

## 2014-12-04 DIAGNOSIS — G4700 Insomnia, unspecified: Secondary | ICD-10-CM

## 2014-12-04 DIAGNOSIS — H6992 Unspecified Eustachian tube disorder, left ear: Secondary | ICD-10-CM | POA: Insufficient documentation

## 2014-12-04 DIAGNOSIS — M5417 Radiculopathy, lumbosacral region: Secondary | ICD-10-CM

## 2014-12-04 DIAGNOSIS — E669 Obesity, unspecified: Secondary | ICD-10-CM

## 2014-12-04 DIAGNOSIS — I10 Essential (primary) hypertension: Secondary | ICD-10-CM

## 2014-12-04 DIAGNOSIS — H6980 Other specified disorders of Eustachian tube, unspecified ear: Secondary | ICD-10-CM | POA: Insufficient documentation

## 2014-12-04 DIAGNOSIS — H6982 Other specified disorders of Eustachian tube, left ear: Secondary | ICD-10-CM

## 2014-12-04 MED ORDER — FLUTICASONE PROPIONATE 50 MCG/ACT NA SUSP: NASAL | Status: DC

## 2014-12-04 MED ORDER — ONDANSETRON 8 MG PO TBDP: 8.0000 mg | ORAL_TABLET | Freq: Three times a day (TID) | ORAL | Status: DC | PRN

## 2014-12-04 MED ORDER — FLUCONAZOLE 150 MG PO TABS: 150.0000 mg | ORAL_TABLET | Freq: Every day | ORAL | Status: DC

## 2014-12-04 MED ORDER — ZOLPIDEM TARTRATE ER 6.25 MG PO TBCR: 6.2500 mg | EXTENDED_RELEASE_TABLET | Freq: Every evening | ORAL | Status: DC | PRN

## 2014-12-04 MED ORDER — AZITHROMYCIN 250 MG PO TABS: ORAL_TABLET | ORAL | Status: DC

## 2014-12-04 MED ORDER — AZITHROMYCIN 250 MG PO TABS
ORAL_TABLET | ORAL | Status: DC
Start: 2014-12-04 — End: 2014-12-04

## 2014-12-04 MED ORDER — FLUTICASONE PROPIONATE 50 MCG/ACT NA SUSP
NASAL | Status: DC
Start: 2014-12-04 — End: 2014-12-04

## 2014-12-04 NOTE — Progress Notes (Signed)
  Subjective:    CC: Follow-up  HPI: Nancy Poole is here with multiple complaints.  Left ear fullness: With mild sinus pain and pressure and yellowish discharge. Moderate, persistent without radiation.  Hypertension: Well controlled  Insomnia: Good initial response with approximately 3-4 hours of sleep on 5 mg of Ambien, needs something with a long duration.  Obesity: Gets some nausea with Contrave, did stop it recently and then restart.  Lumbar radiculopathy: Has done very well with recent right-sided L5-S1 transforaminal epidurals, unfortunately continues to have a recurrence of pain, with insufficient duration of response.  Past medical history, Surgical history, Family history not pertinant except as noted below, Social history, Allergies, and medications have been entered into the medical record, reviewed, and no changes needed.   Review of Systems: No fevers, chills, night sweats, weight loss, chest pain, or shortness of breath.   Objective:    General: Well Developed, well nourished, and in no acute distress.  Neuro: Alert and oriented x3, extra-ocular muscles intact, sensation grossly intact.  HEENT: Normocephalic, atraumatic, pupils equal round reactive to light, neck supple, no masses, no lymphadenopathy, thyroid nonpalpable. Oropharynx, nasopharynx, ear canals are unremarkable, minimally tender over the left maxillary sinus.  Skin: Warm and dry, no rashes. Cardiac: Regular rate and rhythm, no murmurs rubs or gallops, no lower extremity edema.  Respiratory: Clear to auscultation bilaterally. Not using accessory muscles, speaking in full sentences.  Impression and Recommendations:

## 2014-12-04 NOTE — Assessment & Plan Note (Signed)
Azithromycin, intranasal fluticasone

## 2014-12-04 NOTE — Addendum Note (Signed)
Addended by: Doree Albee on: 12/04/2014 04:31 PM   Modules accepted: Orders, Medications

## 2014-12-04 NOTE — Assessment & Plan Note (Signed)
Doing well but insufficient duration of response. Switching to Ambien 6.25.

## 2014-12-04 NOTE — Assessment & Plan Note (Signed)
Zofran

## 2014-12-04 NOTE — Assessment & Plan Note (Signed)
Well controlled, no changes 

## 2014-12-04 NOTE — Assessment & Plan Note (Signed)
Good response to several lumbar epidurals, unfortunately duration was insufficient. She does have axial and radicular symptoms I do think she would be a good candidate for a microdiscectomy. Referral to Dr. Lynann Bologna.

## 2014-12-10 ENCOUNTER — Telehealth: Payer: Self-pay

## 2014-12-10 MED ORDER — METFORMIN HCL ER 500 MG PO TB24: 500.0000 mg | ORAL_TABLET | Freq: Every day | ORAL | Status: DC

## 2014-12-10 NOTE — Telephone Encounter (Signed)
Patient called requested a refill of Metformin, she requested a 60 day supply due to the pricing. Patient was aware and understood that she needed a follow up appt for diabetes for  further refills. Nancy Poole'

## 2014-12-18 ENCOUNTER — Encounter: Payer: Self-pay | Admitting: Physician Assistant

## 2014-12-20 ENCOUNTER — Ambulatory Visit (INDEPENDENT_AMBULATORY_CARE_PROVIDER_SITE_OTHER): Payer: BLUE CROSS/BLUE SHIELD | Admitting: Sports Medicine

## 2014-12-20 ENCOUNTER — Encounter: Payer: Self-pay | Admitting: Sports Medicine

## 2014-12-20 VITALS — BP 152/85 | HR 95 | Wt 165.0 lb

## 2014-12-20 DIAGNOSIS — H6982 Other specified disorders of Eustachian tube, left ear: Secondary | ICD-10-CM | POA: Diagnosis not present

## 2014-12-20 DIAGNOSIS — H6992 Unspecified Eustachian tube disorder, left ear: Secondary | ICD-10-CM

## 2014-12-20 DIAGNOSIS — M5417 Radiculopathy, lumbosacral region: Secondary | ICD-10-CM | POA: Diagnosis not present

## 2014-12-20 DIAGNOSIS — M5416 Radiculopathy, lumbar region: Secondary | ICD-10-CM

## 2014-12-20 DIAGNOSIS — E669 Obesity, unspecified: Secondary | ICD-10-CM

## 2014-12-20 DIAGNOSIS — I10 Essential (primary) hypertension: Secondary | ICD-10-CM | POA: Diagnosis not present

## 2014-12-20 MED ORDER — NALTREXONE-BUPROPION HCL ER 8-90 MG PO TB12
ORAL_TABLET | ORAL | Status: DC
Start: 2014-12-20 — End: 2015-11-08

## 2014-12-20 MED ORDER — LEVOTHYROXINE SODIUM 150 MCG PO TABS: ORAL_TABLET | ORAL | Status: DC

## 2014-12-20 MED ORDER — AMLODIPINE BESYLATE 5 MG PO TABS: 5.0000 mg | ORAL_TABLET | Freq: Every day | ORAL | Status: DC

## 2014-12-20 NOTE — Progress Notes (Signed)
  Subjective:    CC: Follow-up  HPI: Hypothyroidism: Needs a refill on medication.  Hypertension: Needs a refill on medication.  Eustachian tube dysfunction: History of tympanostomy tube placement, unfortunately having a recurrence of symptoms.  Lumbar radiculitis: Has been 3 months of physical therapy, several epidurals, she recently saw Dr. Lynann Bologna who is going to go ahead and proceed with lumbar microdiscectomy.  Obesity: Would like a refill on Contrave, we did discuss that a component of Contrave acts as an opiate antagonist, and will likely block the effects of pain medication given after back surgery, she will avoid Contrave for one week before her microdiscectomy.  Past medical history, Surgical history, Family history not pertinant except as noted below, Social history, Allergies, and medications have been entered into the medical record, reviewed, and no changes needed.   Review of Systems: No fevers, chills, night sweats, weight loss, chest pain, or shortness of breath.   Objective:    General: Well Developed, well nourished, and in no acute distress.  Neuro: Alert and oriented x3, extra-ocular muscles intact, sensation grossly intact.  HEENT: Normocephalic, atraumatic, pupils equal round reactive to light, neck supple, no masses, no lymphadenopathy, thyroid nonpalpable.  Skin: Warm and dry, no rashes. Cardiac: Regular rate and rhythm, no murmurs rubs or gallops, no lower extremity edema.  Respiratory: Clear to auscultation bilaterally. Not using accessory muscles, speaking in full sentences.  Impression and Recommendations:

## 2014-12-20 NOTE — Assessment & Plan Note (Signed)
Scheduled for surgery. Advised to avoid Contrave one week before the operation.

## 2014-12-20 NOTE — Assessment & Plan Note (Signed)
No tubes visible in either ear, she needs to return to ENT.

## 2014-12-20 NOTE — Assessment & Plan Note (Signed)
Refilling Contrave but she will avoid use of it one week before her back surgery due to opiate antagonist.

## 2014-12-28 ENCOUNTER — Telehealth: Payer: Self-pay

## 2014-12-28 DIAGNOSIS — G4700 Insomnia, unspecified: Secondary | ICD-10-CM

## 2014-12-28 MED ORDER — ZOLPIDEM TARTRATE ER 6.25 MG PO TBCR: 6.2500 mg | EXTENDED_RELEASE_TABLET | Freq: Every evening | ORAL | Status: DC | PRN

## 2014-12-28 NOTE — Telephone Encounter (Signed)
Patient left vm @ 10:04 am requesting a refill on Zolpidem (extented release). Rhonda Cunningham,CMA

## 2014-12-28 NOTE — Telephone Encounter (Signed)
rx in box 

## 2014-12-31 NOTE — Telephone Encounter (Signed)
Rx Ambien has been faxed to [facility]. Rhonda Cunningham,CMA

## 2015-01-11 ENCOUNTER — Emergency Department (HOSPITAL_COMMUNITY)
Admission: EM | Admit: 2015-01-11 | Discharge: 2015-01-11 | Disposition: A | Discharge status: Home / Self Care | Payer: BLUE CROSS/BLUE SHIELD | Attending: Emergency Medicine | Admitting: Emergency Medicine

## 2015-01-11 ENCOUNTER — Encounter (HOSPITAL_COMMUNITY): Payer: Self-pay | Admitting: Family Medicine

## 2015-01-11 DIAGNOSIS — Z7951 Long term (current) use of inhaled steroids: Secondary | ICD-10-CM | POA: Diagnosis not present

## 2015-01-11 DIAGNOSIS — Z8589 Personal history of malignant neoplasm of other organs and systems: Secondary | ICD-10-CM | POA: Diagnosis not present

## 2015-01-11 DIAGNOSIS — R42 Dizziness and giddiness: Secondary | ICD-10-CM | POA: Diagnosis not present

## 2015-01-11 DIAGNOSIS — D649 Anemia, unspecified: Secondary | ICD-10-CM | POA: Diagnosis not present

## 2015-01-11 DIAGNOSIS — Z79899 Other long term (current) drug therapy: Secondary | ICD-10-CM | POA: Diagnosis not present

## 2015-01-11 DIAGNOSIS — I10 Essential (primary) hypertension: Secondary | ICD-10-CM | POA: Insufficient documentation

## 2015-01-11 DIAGNOSIS — M5440 Lumbago with sciatica, unspecified side: Secondary | ICD-10-CM | POA: Diagnosis not present

## 2015-01-11 DIAGNOSIS — M545 Low back pain: Secondary | ICD-10-CM

## 2015-01-11 DIAGNOSIS — E119 Type 2 diabetes mellitus without complications: Secondary | ICD-10-CM | POA: Insufficient documentation

## 2015-01-11 DIAGNOSIS — M549 Dorsalgia, unspecified: Secondary | ICD-10-CM | POA: Diagnosis present

## 2015-01-11 LAB — CBC WITH DIFFERENTIAL/PLATELET
Basophils Absolute: 0.1 10*3/uL (ref 0.0–0.1)
Basophils Relative: 1 % (ref 0–1)
Eosinophils Absolute: 0.1 10*3/uL (ref 0.0–0.7)
Eosinophils Relative: 1 % (ref 0–5)
HCT: 30.6 % — ABNORMAL LOW (ref 36.0–46.0)
Hemoglobin: 9.6 g/dL — ABNORMAL LOW (ref 12.0–15.0)
Lymphocytes Relative: 35 % (ref 12–46)
Lymphs Abs: 4 10*3/uL (ref 0.7–4.0)
MCH: 24.4 pg — ABNORMAL LOW (ref 26.0–34.0)
MCHC: 31.4 g/dL (ref 30.0–36.0)
MCV: 77.9 fL — ABNORMAL LOW (ref 78.0–100.0)
Monocytes Absolute: 0.6 10*3/uL (ref 0.1–1.0)
Monocytes Relative: 5 % (ref 3–12)
Neutro Abs: 6.8 10*3/uL (ref 1.7–7.7)
Neutrophils Relative %: 58 % (ref 43–77)
Platelets: 354 10*3/uL (ref 150–400)
RBC: 3.93 MIL/uL (ref 3.87–5.11)
RDW: 16 % — ABNORMAL HIGH (ref 11.5–15.5)
WBC: 11.7 10*3/uL — ABNORMAL HIGH (ref 4.0–10.5)

## 2015-01-11 LAB — I-STAT CHEM 8, ED
BUN: 18 mg/dL (ref 6–23)
Calcium, Ion: 1.07 mmol/L — ABNORMAL LOW (ref 1.12–1.23)
Chloride: 98 mmol/L (ref 96–112)
Creatinine, Ser: 1.1 mg/dL (ref 0.50–1.10)
Glucose, Bld: 94 mg/dL (ref 70–99)
HCT: 31 % — ABNORMAL LOW (ref 36.0–46.0)
Hemoglobin: 10.5 g/dL — ABNORMAL LOW (ref 12.0–15.0)
Potassium: 3.7 mmol/L (ref 3.5–5.1)
Sodium: 137 mmol/L (ref 135–145)
TCO2: 23 mmol/L (ref 0–100)

## 2015-01-11 LAB — CBG MONITORING, ED: Glucose-Capillary: 115 mg/dL — ABNORMAL HIGH (ref 70–99)

## 2015-01-11 MED ORDER — SODIUM CHLORIDE 0.9 % IV BOLUS (SEPSIS)
1000.0000 mL | Freq: Once | INTRAVENOUS | Status: AC
  Administered 2015-01-11: 1000 mL via INTRAVENOUS

## 2015-01-11 MED ORDER — DIAZEPAM 5 MG PO TABS
5.0000 mg | ORAL_TABLET | Freq: Once | ORAL | Status: AC
  Administered 2015-01-11: 5 mg via ORAL
  Filled 2015-01-11: qty 1

## 2015-01-11 MED ORDER — FERROUS SULFATE 75 (15 FE) MG/ML PO SOLN: 15.0000 mg | Freq: Every day | ORAL | Status: DC

## 2015-01-11 NOTE — ED Notes (Addendum)
Pt given coke and crackers --  

## 2015-01-11 NOTE — Discharge Instructions (Signed)
Return to the emergency room with worsening of symptoms, new symptoms or with symptoms that are concerning , especially fevers, loss of control of bladder or bowels, numbness or tingling around genital region or anus, weakness or , severe headache, visual or speech changes, weakness in face, arms or legs. Call to make a follow-up appointment with your surgeon. Go to your MRI appointment today. When taking Valium do not drink, drive, operate machinery, take other sedating meds. Read below information and follow recommendations. Back Injury Prevention Back injuries can be extremely painful and difficult to heal. After having one back injury, you are much more likely to experience another later on. It is important to learn how to avoid injuring or re-injuring your back. The following tips can help you to prevent a back injury. PHYSICAL FITNESS  Exercise regularly and try to develop good tone in your abdominal muscles. Your abdominal muscles provide a lot of the support needed by your back.  Do aerobic exercises (walking, jogging, biking, swimming) regularly.  Do exercises that increase balance and strength (tai chi, yoga) regularly. This can decrease your risk of falling and injuring your back.  Stretch before and after exercising.  Maintain a healthy weight. The more you weigh, the more stress is placed on your back. For every pound of weight, 10 times that amount of pressure is placed on the back. DIET  Talk to your caregiver about how much calcium and vitamin D you need per day. These nutrients help to prevent weakening of the bones (osteoporosis). Osteoporosis can cause broken (fractured) bones that lead to back pain.  Include good sources of calcium in your diet, such as dairy products, green, leafy vegetables, and products with calcium added (fortified).  Include good sources of vitamin D in your diet, such as milk and foods that are fortified with vitamin D.  Consider taking a  nutritional supplement or a multivitamin if needed.  Stop smoking if you smoke. POSTURE  Sit and stand up straight. Avoid leaning forward when you sit or hunching over when you stand.  Choose chairs with good low back (lumbar) support.  If you work at a desk, sit close to your work so you do not need to lean over. Keep your chin tucked in. Keep your neck drawn back and elbows bent at a right angle. Your arms should look like the letter "L."  Sit high and close to the steering wheel when you drive. Add a lumbar support to your car seat if needed.  Avoid sitting or standing in one position for too long. Take breaks to get up, stretch, and walk around at least once every hour. Take breaks if you are driving for long periods of time.  Sleep on your side with your knees slightly bent, or sleep on your back with a pillow under your knees. Do not sleep on your stomach. LIFTING, TWISTING, AND REACHING  Avoid heavy lifting, especially repetitive lifting. If you must do heavy lifting:  Stretch before lifting.  Work slowly.  Rest between lifts.  Use carts and dollies to move objects when possible.  Make several small trips instead of carrying 1 heavy load.  Ask for help when you need it.  Ask for help when moving big, awkward objects.  Follow these steps when lifting:  Stand with your feet shoulder-width apart.  Get as close to the object as you can. Do not try to pick up heavy objects that are far from your body.  Use handles or lifting  straps if they are available.  Bend at your knees. Squat down, but keep your heels off the floor.  Keep your shoulders pulled back, your chin tucked in, and your back straight.  Lift the object slowly, tightening the muscles in your legs, abdomen, and buttocks. Keep the object as close to the center of your body as possible.  When you put a load down, use these same guidelines in reverse.  Do not:  Lift the object above your waist.  Twist  at the waist while lifting or carrying a load. Move your feet if you need to turn, not your waist.  Bend over without bending at your knees.  Avoid reaching over your head, across a table, or for an object on a high surface. OTHER TIPS  Avoid wet floors and keep sidewalks clear of ice to prevent falls.  Do not sleep on a mattress that is too soft or too hard.  Keep items that are used frequently within easy reach.  Put heavier objects on shelves at waist level and lighter objects on lower or higher shelves.  Find ways to decrease your stress, such as exercise, massage, or relaxation techniques. Stress can build up in your muscles. Tense muscles are more vulnerable to injury.  Seek treatment for depression or anxiety if needed. These conditions can increase your risk of developing back pain. SEEK MEDICAL CARE IF:  You injure your back.  You have questions about diet, exercise, or other ways to prevent back injuries. MAKE SURE YOU:  Understand these instructions.  Will watch your condition.  Will get help right away if you are not doing well or get worse. Document Released: 12/03/2004 Document Revised: 01/18/2012 Document Reviewed: 12/07/2011 [facility] Patient Information 2015 [place], [place]. This information is not intended to replace advice given to you by your health care provider. Make sure you discuss any questions you have with your health care provider.

## 2015-01-11 NOTE — ED Provider Notes (Signed)
CSN: [id number]     Arrival date & time 01/11/15  1214 History   First MD Initiated Contact with Patient 01/11/15 1222     Chief Complaint  Patient presents with  . Back Pain  . Hypotension     (Consider location/radiation/quality/duration/timing/severity/associated sxs/prior Treatment) HPI  Nancy Poole is a 49 y.o. female with PMH of DM, herniated disc s/p lumbar surgery last week presenting with persistent back pain with 20 minute long muscle spasms that are improved with Valium but she reports that she has breakthrough spasms. Patient also presented for follow-up and was found to have hypotension 80/50 as well as question of hypoglycemia. Patient was encouraged to present to the emergency room. Patient without hypotension here in the ED. She is reporting generalized lightheadedness with standing. She reports normal oral intake. She has had reoccurrence of her back pain with return of numbness tingling in the tips of her right toes. She states is the same as before her surgery. Any headache, slurred speech, weakness, visual changes. No headache or vertigo symptoms.   Past Medical History  Diagnosis Date  . Diabetes mellitus without complication   . Hypertension   . Cancer   . Herniated disc    Past Surgical History  Procedure Laterality Date  . Thyroidectomy     History reviewed. No pertinent family history. History  Substance Use Topics  . Smoking status: Never Smoker   . Smokeless tobacco: Not on file  . Alcohol Use: No   OB History    No data available     Review of Systems  10 Systems reviewed and are negative for acute change except as noted in the HPI.    Allergies  Tetracyclines & related  Home Medications   Prior to Admission medications   Medication Sig Start Date End Date Taking? Authorizing Provider  amLODipine (NORVASC) 5 MG tablet Take 1 tablet (5 mg total) by mouth daily. 12/20/14  Yes Silverio Decamp, MD  Blood Glucose Monitoring Suppl (FIFTY50  GLUCOSE METER 2.0) W/DEVICE KIT Frequency:PHARMDIR   Dosage:0.0     Instructions:  Note:lancets, strips and 1 glucometer, for testing up to 3 times per day with meals and at HS. DX 250.00. Dose: SEE BELOW 12/01/12  Yes Historical Provider, MD  Calcium Carb-Cholecalciferol (CALCIUM 1000 + D PO) Take by mouth.   Yes Historical Provider, MD  diazepam (VALIUM) 5 MG tablet Take 5 mg by mouth 2 (two) times daily as needed. anxiety 01/07/15  Yes Historical Provider, MD  fluticasone (FLONASE) 50 MCG/ACT nasal spray One spray in each nostril twice a day, use left hand for right nostril, and right hand for left nostril. 12/04/14  Yes Silverio Decamp, MD  Levonorgestrel-Ethinyl Estrad (ORSYTHIA PO) Take by mouth daily.   Yes Historical Provider, MD  levothyroxine (SYNTHROID, LEVOTHROID) 150 MCG tablet TAKE 1 TABLET BY MOUTH EVERY DAY BEFORE BREAKFAST 12/20/14  Yes Silverio Decamp, MD  liothyronine (CYTOMEL) 5 MCG tablet Take 5 mcg by mouth daily.   Yes Historical Provider, MD  metFORMIN (GLUCOPHAGE-XR) 500 MG 24 hr tablet Take 1 tablet (500 mg total) by mouth daily with breakfast. 12/10/14  Yes Silverio Decamp, MD  methylPREDNIsolone (MEDROL DOSPACK) 4 MG tablet Take 1 tablet by mouth See admin instructions. 6 day regimen started 01/09/15 01/07/15  Yes Historical Provider, MD  Multiple Vitamins-Minerals (WOMENS MULTIVITAMIN PLUS PO) Take by mouth daily.   Yes Historical Provider, MD  omeprazole (PRILOSEC) 40 MG capsule TAKE 1 CAPSULE DAILY 10/01/14  Yes Silverio Decamp, MD  oxyCODONE-acetaminophen (PERCOCET/ROXICET) 5-325 MG per tablet Take 1 tablet by mouth every 6 (six) hours as needed. pain 01/04/15  Yes Historical Provider, MD  tamsulosin (FLOMAX) 0.4 MG CAPS capsule Take 0.4 mg by mouth. 08/30/14 08/30/15 Yes Historical Provider, MD  traZODone (DESYREL) 50 MG tablet Take 100 mg by mouth at bedtime.  07/24/14  Yes Historical Provider, MD  ferrous sulfate (FER-IN-SOL) 75 (15 FE) MG/ML SOLN Take 1  mL (15 mg of iron total) by mouth daily. 01/11/15   Pura Spice, PA-C  Naltrexone-Bupropion HCl ER (CONTRAVE) 8-90 MG TB12 TAKE 2 TABLETS BY MOUTH TWICE DAILY Patient not taking: Reported on 01/11/2015 12/20/14   Silverio Decamp, MD  ondansetron (ZOFRAN-ODT) 8 MG disintegrating tablet Take 1 tablet (8 mg total) by mouth every 8 (eight) hours as needed for nausea. Patient not taking: Reported on 01/11/2015 12/04/14   Silverio Decamp, MD  zolpidem (AMBIEN CR) 6.25 MG CR tablet Take 1 tablet (6.25 mg total) by mouth at bedtime as needed for sleep. Patient not taking: Reported on 01/11/2015 12/28/14   Silverio Decamp, MD   BP 123/68 mmHg  Pulse 84  Temp(Src) 98.1 F (36.7 C) (Oral)  Resp 16  SpO2 99% Physical Exam  Constitutional: She appears well-developed and well-nourished. No distress.  HENT:  Head: Normocephalic and atraumatic.  Mouth/Throat: Oropharynx is clear and moist.  Eyes: Conjunctivae and EOM are normal. Pupils are equal, round, and reactive to light. Right eye exhibits no discharge. Left eye exhibits no discharge.  Neck: Normal range of motion. Neck supple.  No nuchal rigidity  Cardiovascular: Normal rate and regular rhythm.   Pulmonary/Chest: Effort normal and breath sounds normal. No respiratory distress. She has no wheezes.  Abdominal: Soft. Bowel sounds are normal. She exhibits no distension. There is no tenderness.  Musculoskeletal:  No significant midline tenderness. Right low back tenderness. Patient with lumbar incision with expected induration without any drainage. Is clear and intact. No significant erythema or warmth.  Neurological: She is alert. No cranial nerve deficit. Coordination normal.  Speech is clear and goal oriented. Strength 5/5 in upper and lower extremities. Sensation intact. DTR intact. No pronator drift. Normal gait.   Skin: Skin is warm and dry. She is not diaphoretic.  Nursing note and vitals reviewed.   ED Course  Procedures  (including critical care time) Labs Review Labs Reviewed  CBC WITH DIFFERENTIAL/PLATELET - Abnormal; Notable for the following:    WBC 11.7 (*)    Hemoglobin 9.6 (*)    HCT 30.6 (*)    MCV 77.9 (*)    MCH 24.4 (*)    RDW 16.0 (*)    All other components within normal limits  CBG MONITORING, ED - Abnormal; Notable for the following:    Glucose-Capillary 115 (*)    All other components within normal limits  I-STAT CHEM 8, ED - Abnormal; Notable for the following:    Calcium, Ion 1.07 (*)    Hemoglobin 10.5 (*)    HCT 31.0 (*)    All other components within normal limits    Imaging Review No results found.   EKG Interpretation   Date/Time:  Friday January 11 2015 12:58:29 EST Ventricular Rate:  77 PR Interval:  150 QRS Duration: 82 QT Interval:  399 QTC Calculation: 452 R Axis:   63 Text Interpretation:  Sinus rhythm Artifact and baseline wander  No  significant change since last tracing Confirmed by WARD,  DO, KRISTEN  (  [id number]) on 01/11/2015 1:06:15 PM      MDM   Final diagnoses:  Lightheadedness  Right low back pain, with sciatica presence unspecified  Anemia, unspecified anemia type   Patient status post lumbar spine surgery presenting with reported hypo-tension where labs were to be drawn before her MRI scheduled for today. VSS in ED. Patient with chronic back pain but no other complaints. Normal neurological exam other than reported numbness tingling to right toes. This is chronic from for surgery. I doubt cauda equina. Incision clear dry and intact.  Patient also with lightheadedness with normal neurological exam. EKG without acute abnormalities. Patient without electrolyte abnormalities. She does have anemia and reports significant blood loss from menses. She reports no hematochezia or hematemesis. Patient given iron supplementation. Glucose 115 today. Patient with evidence of orthostasis. Patient stable for discharge. Patient with appointment today at 2:30pm for her  MRI. He is to follow-up with her surgeon.  Discussed return precautions with patient. Discussed all results and patient verbalizes understanding and agrees with plan.  Case has been discussed with Dr. Leonides Schanz who agrees with the above plan and to discharge.    Pura Spice, PA-C 01/11/15 [place] Ward, DO 01/11/15 1550

## 2015-01-11 NOTE — ED Notes (Addendum)
Pt here for issues after back surgery. sts she has been having 20 minute long spasms, hypotension and hypoglycemia.  sts also that her incision is swollen and warm to touch.

## 2015-01-25 ENCOUNTER — Other Ambulatory Visit: Payer: Self-pay | Admitting: Orthopedic Surgery

## 2015-01-29 ENCOUNTER — Other Ambulatory Visit: Payer: Self-pay | Admitting: Orthopedic Surgery

## 2015-01-29 DIAGNOSIS — M545 Low back pain: Principal | ICD-10-CM

## 2015-01-29 DIAGNOSIS — G8929 Other chronic pain: Secondary | ICD-10-CM

## 2015-01-29 DIAGNOSIS — E209 Hypoparathyroidism, unspecified: Secondary | ICD-10-CM | POA: Insufficient documentation

## 2015-01-30 ENCOUNTER — Ambulatory Visit
Admission: RE | Admit: 2015-01-30 | Discharge: 2015-01-30 | Disposition: A | Discharge status: Home / Self Care | Payer: BLUE CROSS/BLUE SHIELD | Source: Ambulatory Visit | Attending: Orthopedic Surgery | Admitting: Orthopedic Surgery

## 2015-01-30 DIAGNOSIS — G8929 Other chronic pain: Secondary | ICD-10-CM

## 2015-01-30 DIAGNOSIS — M545 Low back pain, unspecified: Secondary | ICD-10-CM

## 2015-01-30 MED ORDER — METHYLPREDNISOLONE ACETATE 40 MG/ML INJ SUSP (RADIOLOG
120.0000 mg | Freq: Once | INTRAMUSCULAR | Status: AC
  Administered 2015-01-30: 120 mg via EPIDURAL

## 2015-01-30 MED ORDER — IOHEXOL 180 MG/ML  SOLN
1.0000 mL | Freq: Once | INTRAMUSCULAR | Status: AC | PRN
  Administered 2015-01-30: 1 mL via EPIDURAL

## 2015-02-02 ENCOUNTER — Other Ambulatory Visit: Payer: Self-pay | Admitting: Sports Medicine

## 2015-08-17 ENCOUNTER — Other Ambulatory Visit: Payer: Self-pay | Admitting: Sports Medicine

## 2015-11-08 ENCOUNTER — Encounter: Payer: Self-pay | Admitting: Sports Medicine

## 2015-11-08 ENCOUNTER — Ambulatory Visit (INDEPENDENT_AMBULATORY_CARE_PROVIDER_SITE_OTHER): Payer: BLUE CROSS/BLUE SHIELD | Admitting: Sports Medicine

## 2015-11-08 VITALS — BP 133/80 | HR 70 | Temp 98.0°F | Resp 18 | Wt 194.0 lb

## 2015-11-08 DIAGNOSIS — M722 Plantar fascial fibromatosis: Secondary | ICD-10-CM | POA: Diagnosis not present

## 2015-11-08 DIAGNOSIS — H6993 Unspecified Eustachian tube disorder, bilateral: Secondary | ICD-10-CM

## 2015-11-08 DIAGNOSIS — E119 Type 2 diabetes mellitus without complications: Secondary | ICD-10-CM

## 2015-11-08 DIAGNOSIS — E669 Obesity, unspecified: Secondary | ICD-10-CM | POA: Diagnosis not present

## 2015-11-08 DIAGNOSIS — H6983 Other specified disorders of Eustachian tube, bilateral: Secondary | ICD-10-CM

## 2015-11-08 LAB — COMPREHENSIVE METABOLIC PANEL
ALT: 10 U/L (ref 6–29)
AST: 16 U/L (ref 10–35)
Albumin: 3.8 g/dL (ref 3.6–5.1)
Alkaline Phosphatase: 79 U/L (ref 33–130)
BUN: 9 mg/dL (ref 7–25)
CO2: 26 mmol/L (ref 20–31)
Calcium: 8.3 mg/dL — ABNORMAL LOW (ref 8.6–10.4)
Chloride: 100 mmol/L (ref 98–110)
Creat: 0.82 mg/dL (ref 0.50–1.05)
Glucose, Bld: 182 mg/dL — ABNORMAL HIGH (ref 65–99)
Potassium: 3.8 mmol/L (ref 3.5–5.3)
Sodium: 136 mmol/L (ref 135–146)
Total Bilirubin: 0.4 mg/dL (ref 0.2–1.2)
Total Protein: 6.7 g/dL (ref 6.1–8.1)

## 2015-11-08 LAB — CBC
HCT: 35.9 % — ABNORMAL LOW (ref 36.0–46.0)
Hemoglobin: 11.8 g/dL — ABNORMAL LOW (ref 12.0–15.0)
MCH: 27.2 pg (ref 26.0–34.0)
MCHC: 32.9 g/dL (ref 30.0–36.0)
MCV: 82.7 fL (ref 78.0–100.0)
MPV: 9.7 fL (ref 8.6–12.4)
Platelets: 325 10*3/uL (ref 150–400)
RBC: 4.34 MIL/uL (ref 3.87–5.11)
RDW: 14.7 % (ref 11.5–15.5)
WBC: 6.8 10*3/uL (ref 4.0–10.5)

## 2015-11-08 LAB — LIPID PANEL
Cholesterol: 180 mg/dL (ref 125–200)
HDL: 51 mg/dL (ref >=46)
LDL Cholesterol: 92 mg/dL (ref <130)
Total CHOL/HDL Ratio: 3.5 Ratio (ref <=5.0)
Triglycerides: 186 mg/dL — ABNORMAL HIGH (ref <150)
VLDL: 37 mg/dL — ABNORMAL HIGH (ref <30)

## 2015-11-08 MED ORDER — FLUTICASONE PROPIONATE 50 MCG/ACT NA SUSP: NASAL | Status: DC

## 2015-11-08 MED ORDER — EXENATIDE ER 2 MG ~~LOC~~ PEN: 2.0000 mg | PEN_INJECTOR | SUBCUTANEOUS | Status: DC

## 2015-11-08 MED ORDER — PHENTERMINE HCL 37.5 MG PO TABS: ORAL_TABLET | ORAL | Status: DC

## 2015-11-08 NOTE — Progress Notes (Signed)
  Subjective:    CC: Follow-up  HPI: I have not seen this pleasant 49 year old female in some time, she does desire to restart weight loss treatment.  Diabetes mellitus type 2: Feeling low blood sugar, with readings in the 60s while work on metformin. Desires to switch.  Bilateral plantar fasciitis: Needs a new set of custom orthotics.  Past medical history, Surgical history, Family history not pertinant except as noted below, Social history, Allergies, and medications have been entered into the medical record, reviewed, and no changes needed.   Review of Systems: No fevers, chills, night sweats, weight loss, chest pain, or shortness of breath.   Objective:    General: Well Developed, well nourished, and in no acute distress.  Neuro: Alert and oriented x3, extra-ocular muscles intact, sensation grossly intact.  HEENT: Normocephalic, atraumatic, pupils equal round reactive to light, neck supple, no masses, no lymphadenopathy, thyroid nonpalpable.  Skin: Warm and dry, no rashes. Cardiac: Regular rate and rhythm, no murmurs rubs or gallops, no lower extremity edema.  Respiratory: Clear to auscultation bilaterally. Not using accessory muscles, speaking in full sentences.  Impression and Recommendations:    I spent 25 minutes with this patient, greater than 50% was face-to-face time counseling regarding the above diagnoses

## 2015-11-08 NOTE — Assessment & Plan Note (Signed)
Patient will return for custom orthotics. 

## 2015-11-08 NOTE — Assessment & Plan Note (Addendum)
Discontinue metformin. Adding Bydureon.  Bydureon is too expensive, switching to Trulicity.

## 2015-11-08 NOTE — Assessment & Plan Note (Signed)
Restarting phentermine, return monthly for weight checks and refills. Adding Bydureon

## 2015-11-08 NOTE — Assessment & Plan Note (Signed)
Restarting Flonase

## 2015-11-09 LAB — MICROALBUMIN / CREATININE URINE RATIO
Creatinine, Urine: 88 mg/dL (ref 20–320)
Microalb Creat Ratio: 3 mcg/mg creat (ref <30)
Microalb, Ur: 0.3 mg/dL

## 2015-11-09 LAB — HEMOGLOBIN A1C
Hgb A1c MFr Bld: 6.8 % — ABNORMAL HIGH (ref <5.7)
Mean Plasma Glucose: 148 mg/dL — ABNORMAL HIGH (ref <117)

## 2015-11-09 LAB — VITAMIN D 25 HYDROXY (VIT D DEFICIENCY, FRACTURES): Vit D, 25-Hydroxy: 25 ng/mL — ABNORMAL LOW (ref 30–100)

## 2015-11-09 LAB — TSH: TSH: 0.575 u[IU]/mL (ref 0.350–4.500)

## 2015-11-09 MED ORDER — VITAMIN D (ERGOCALCIFEROL) 1.25 MG (50000 UNIT) PO CAPS: 50000.0000 [IU] | ORAL_CAPSULE | ORAL | Status: DC

## 2015-11-09 NOTE — Addendum Note (Signed)
Addended by: Silverio Decamp on: 11/09/2015 10:41 AM   Modules accepted: Orders

## 2015-11-12 MED ORDER — DULAGLUTIDE 1.5 MG/0.5ML ~~LOC~~ SOAJ: 1.5000 mg | SUBCUTANEOUS | Status: DC

## 2015-11-12 NOTE — Addendum Note (Signed)
Addended by: Silverio Decamp on: 11/12/2015 10:29 AM   Modules accepted: Orders, Medications

## 2015-12-18 ENCOUNTER — Other Ambulatory Visit: Payer: Self-pay | Admitting: Sports Medicine

## 2016-01-14 ENCOUNTER — Ambulatory Visit (INDEPENDENT_AMBULATORY_CARE_PROVIDER_SITE_OTHER): Payer: BLUE CROSS/BLUE SHIELD | Admitting: Sports Medicine

## 2016-01-14 VITALS — BP 141/84 | HR 90 | Resp 18 | Wt 179.2 lb

## 2016-01-14 DIAGNOSIS — H6983 Other specified disorders of Eustachian tube, bilateral: Secondary | ICD-10-CM

## 2016-01-14 DIAGNOSIS — E119 Type 2 diabetes mellitus without complications: Secondary | ICD-10-CM

## 2016-01-14 DIAGNOSIS — E669 Obesity, unspecified: Secondary | ICD-10-CM | POA: Diagnosis not present

## 2016-01-14 DIAGNOSIS — H6993 Unspecified Eustachian tube disorder, bilateral: Secondary | ICD-10-CM

## 2016-01-14 MED ORDER — DULAGLUTIDE 1.5 MG/0.5ML ~~LOC~~ SOAJ: 1.5000 mg | SUBCUTANEOUS | Status: DC

## 2016-01-14 MED ORDER — PHENTERMINE HCL 37.5 MG PO TABS: ORAL_TABLET | ORAL | Status: DC

## 2016-01-14 NOTE — Assessment & Plan Note (Addendum)
Cannot tolerate metformin or DPP 4 inhibitor's or SGLT 2 agonists. Bydureon was too expensive, we are going to resubmit Trulicity. 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

## 2016-01-14 NOTE — Progress Notes (Signed)
  Subjective:    CC: Follow-up  HPI: Obesity: Never followed up, desires a refill on phentermine, she has lost weight.  Diabetes mellitus type 2: Unable to tolerate metformin, DPP4 inhibitor's, SGLT2 agonists, we were initially unable to get GLP-1 agonists approved. This needs to be resubmitted. She does have a history of thyroid carcinoma however it is papillary subtype, so she is safe to proceed with Trulicity.  Past medical history, Surgical history, Family history not pertinant except as noted below, Social history, Allergies, and medications have been entered into the medical record, reviewed, and no changes needed.   Review of Systems: No fevers, chills, night sweats, weight loss, chest pain, or shortness of breath.   Objective:    General: Well Developed, well nourished, and in no acute distress.  Neuro: Alert and oriented x3, extra-ocular muscles intact, sensation grossly intact.  HEENT: Normocephalic, atraumatic, pupils equal round reactive to light, neck supple, no masses, no lymphadenopathy, thyroid nonpalpable.  Skin: Warm and dry, no rashes. Cardiac: Regular rate and rhythm, no murmurs rubs or gallops, no lower extremity edema.  Respiratory: Clear to auscultation bilaterally. Not using accessory muscles, speaking in full sentences.  Impression and Recommendations:    I spent 40 minutes with this patient, greater than 50% was face-to-face time counseling regarding the above diagnoses

## 2016-01-14 NOTE — Assessment & Plan Note (Signed)
Restarting phentermine, will give her one more chance.

## 2016-01-14 NOTE — Assessment & Plan Note (Signed)
Increase Flonase to 2 sprays twice a day

## 2016-02-11 ENCOUNTER — Ambulatory Visit (INDEPENDENT_AMBULATORY_CARE_PROVIDER_SITE_OTHER): Payer: BLUE CROSS/BLUE SHIELD | Admitting: Sports Medicine

## 2016-02-11 ENCOUNTER — Other Ambulatory Visit: Payer: Self-pay | Admitting: Sports Medicine

## 2016-02-11 VITALS — BP 119/74 | HR 75 | Resp 18 | Wt 175.1 lb

## 2016-02-11 DIAGNOSIS — E669 Obesity, unspecified: Secondary | ICD-10-CM | POA: Diagnosis not present

## 2016-02-11 DIAGNOSIS — E119 Type 2 diabetes mellitus without complications: Secondary | ICD-10-CM | POA: Diagnosis not present

## 2016-02-11 MED ORDER — DAPAGLIFLOZIN PRO-METFORMIN ER 10-1000 MG PO TB24: 1.0000 | ORAL_TABLET | Freq: Every day | ORAL | Status: DC

## 2016-02-11 MED ORDER — PHENTERMINE HCL 37.5 MG PO TABS: ORAL_TABLET | ORAL | Status: DC

## 2016-02-11 NOTE — Assessment & Plan Note (Signed)
Switching to [employer]

## 2016-02-11 NOTE — Assessment & Plan Note (Signed)
Trulicity will be too expensive. Continue phentermine, return in one month, we are entering the third month

## 2016-02-11 NOTE — Progress Notes (Signed)
  Subjective:    CC: follow-up  HPI: Obesity: Good continued weight loss after 2 months phentermine.  Diabetes mellitus type 2: Unable to get approval for any of the GLP-1 agonists.  Past medical history, Surgical history, Family history not pertinant except as noted below, Social history, Allergies, and medications have been entered into the medical record, reviewed, and no changes needed.   Review of Systems: No fevers, chills, night sweats, weight loss, chest pain, or shortness of breath.   Objective:    General: Well Developed, well nourished, and in no acute distress.  Neuro: Alert and oriented x3, extra-ocular muscles intact, sensation grossly intact.  HEENT: Normocephalic, atraumatic, pupils equal round reactive to light, neck supple, no masses, no lymphadenopathy, thyroid nonpalpable.  Skin: Warm and dry, no rashes. Cardiac: Regular rate and rhythm, no murmurs rubs or gallops, no lower extremity edema.  Respiratory: Clear to auscultation bilaterally. Not using accessory muscles, speaking in full sentences.  Impression and Recommendations:

## 2016-03-12 DIAGNOSIS — M5416 Radiculopathy, lumbar region: Secondary | ICD-10-CM | POA: Diagnosis not present

## 2016-03-12 DIAGNOSIS — G894 Chronic pain syndrome: Secondary | ICD-10-CM | POA: Diagnosis not present

## 2016-03-12 DIAGNOSIS — M961 Postlaminectomy syndrome, not elsewhere classified: Secondary | ICD-10-CM | POA: Diagnosis not present

## 2016-03-17 ENCOUNTER — Other Ambulatory Visit: Payer: Self-pay

## 2016-03-17 MED ORDER — FLUCONAZOLE 150 MG PO TABS: 150.0000 mg | ORAL_TABLET | Freq: Once | ORAL | Status: DC

## 2016-03-17 NOTE — Telephone Encounter (Signed)
Rx sent in.Start with a single pill first, if insufficient response we do need to test for urinary tract infection rather than yeast infection which is going to be more common with XigDuo.

## 2016-03-17 NOTE — Telephone Encounter (Signed)
Pt states Merleen Nicely has caused her to have a yeast infection and it usually takes 3 diflucan to clear it up and would also like to know if she can have refills. Please advise.

## 2016-03-17 NOTE — Telephone Encounter (Signed)
Pt.notified

## 2016-03-24 ENCOUNTER — Ambulatory Visit (INDEPENDENT_AMBULATORY_CARE_PROVIDER_SITE_OTHER): Payer: BLUE CROSS/BLUE SHIELD | Admitting: Sports Medicine

## 2016-03-24 ENCOUNTER — Encounter: Payer: Self-pay | Admitting: Sports Medicine

## 2016-03-24 VITALS — BP 120/76 | HR 16 | Wt 170.4 lb

## 2016-03-24 DIAGNOSIS — L821 Other seborrheic keratosis: Secondary | ICD-10-CM

## 2016-03-24 DIAGNOSIS — E669 Obesity, unspecified: Secondary | ICD-10-CM | POA: Diagnosis not present

## 2016-03-24 DIAGNOSIS — E119 Type 2 diabetes mellitus without complications: Secondary | ICD-10-CM | POA: Diagnosis not present

## 2016-03-24 DIAGNOSIS — L989 Disorder of the skin and subcutaneous tissue, unspecified: Secondary | ICD-10-CM | POA: Insufficient documentation

## 2016-03-24 MED ORDER — PHENTERMINE HCL 37.5 MG PO TABS: ORAL_TABLET | ORAL | Status: DC

## 2016-03-24 NOTE — Assessment & Plan Note (Signed)
Question basal cell carcinoma, excisional biopsy as above, closed with #3 Ethilon sutures, return in one week for suture removal.

## 2016-03-24 NOTE — Progress Notes (Signed)
  Subjective:    CC: Follow-up  HPI: Obesity: After 3 months of phentermine she is lost an additional 4 pounds.  Diabetes mellitus type 2: Doing well on XigDuo.   Skin lesion: Left side of the abdomen, has been growing. History of Mohs surgery on the face. Patient thinks for squamous cell carcinoma.  Past medical history, Surgical history, Family history not pertinant except as noted below, Social history, Allergies, and medications have been entered into the medical record, reviewed, and no changes needed.   Review of Systems: No fevers, chills, night sweats, weight loss, chest pain, or shortness of breath.   Objective:    General: Well Developed, well nourished, and in no acute distress.  Neuro: Alert and oriented x3, extra-ocular muscles intact, sensation grossly intact.  HEENT: Normocephalic, atraumatic, pupils equal round reactive to light, neck supple, no masses, no lymphadenopathy, thyroid nonpalpable.  Skin: Warm and dry, no rashes.There is a 2 cm hyperpigmented, raised lesion over the left anterior upper abdomen. There are some overlying telangiectasias concerning for basal cell carcinoma  Cardiac: Regular rate and rhythm, no murmurs rubs or gallops, no lower extremity edema.  Respiratory: Clear to auscultation bilaterally. Not using accessory muscles, speaking in full sentences.  Procedure:  Excision of  left anterior abdominal 2 cm questionable basal cell carcinoma Risks, benefits, and alternatives explained and consent obtained. Time out conducted. Surface prepped with alcohol. 5cc lidocaine with epinephine infiltrated in a field block. Adequate anesthesia ensured. Area prepped and draped in a sterile fashion. Excision performed with: Using a #11 blade I made an elliptical incision around the structure, I carried the incision down to the sub-continues to his, the lesion was removed en bloc, I placed #3, 3-0 Ethilon sutures to close the wound, simple interrupted. Hemostasis  achieved. Pt stable.  Impression and Recommendations:

## 2016-03-24 NOTE — Assessment & Plan Note (Signed)
4 pound weight loss, we finished 3 months of phentermine.

## 2016-03-31 ENCOUNTER — Ambulatory Visit (INDEPENDENT_AMBULATORY_CARE_PROVIDER_SITE_OTHER): Payer: BLUE CROSS/BLUE SHIELD | Admitting: Sports Medicine

## 2016-03-31 ENCOUNTER — Encounter: Payer: Self-pay | Admitting: Sports Medicine

## 2016-03-31 VITALS — BP 123/78 | HR 73 | Resp 18 | Wt 170.0 lb

## 2016-03-31 DIAGNOSIS — L989 Disorder of the skin and subcutaneous tissue, unspecified: Secondary | ICD-10-CM

## 2016-03-31 DIAGNOSIS — L918 Other hypertrophic disorders of the skin: Secondary | ICD-10-CM | POA: Insufficient documentation

## 2016-03-31 NOTE — Assessment & Plan Note (Signed)
Benign seborrheic keratosis, sutures removed today

## 2016-03-31 NOTE — Progress Notes (Signed)
  Subjective:    CC: Follow-up  HPI: Skin lesion excision is doing well, pathology results showed benign seborrheic keratosis, 3 sutures are to be removed today.  Skin tags: Localized on the face, approximately 5-6 are present, agreeable to proceed with cryotherapy but at the next visit, she does have a wedding coming up on Friday that she has to attend.  Past medical history, Surgical history, Family history not pertinant except as noted below, Social history, Allergies, and medications have been entered into the medical record, reviewed, and no changes needed.   Review of Systems: No fevers, chills, night sweats, weight loss, chest pain, or shortness of breath.   Objective:    General: Well Developed, well nourished, and in no acute distress.  Neuro: Alert and oriented x3, extra-ocular muscles intact, sensation grossly intact.  HEENT: Normocephalic, atraumatic, pupils equal round reactive to light, neck supple, no masses, no lymphadenopathy, thyroid nonpalpable.  Skin: Warm and dry, no rashes.  Several skin tags on the face, sutures removed from the chest wall seborrheic keratosis excision, incision is clean, dry, and intact. Cardiac: Regular rate and rhythm, no murmurs rubs or gallops, no lower extremity edema.  Respiratory: Clear to auscultation bilaterally. Not using accessory muscles, speaking in full sentences.  Impression and Recommendations:

## 2016-03-31 NOTE — Assessment & Plan Note (Signed)
There were 5-6 on her face, she will return for cryotherapy.

## 2016-04-13 DIAGNOSIS — Z7984 Long term (current) use of oral hypoglycemic drugs: Secondary | ICD-10-CM | POA: Diagnosis not present

## 2016-04-13 DIAGNOSIS — E559 Vitamin D deficiency, unspecified: Secondary | ICD-10-CM | POA: Diagnosis not present

## 2016-04-13 DIAGNOSIS — Z6828 Body mass index (BMI) 28.0-28.9, adult: Secondary | ICD-10-CM | POA: Diagnosis not present

## 2016-04-13 DIAGNOSIS — C73 Malignant neoplasm of thyroid gland: Secondary | ICD-10-CM | POA: Diagnosis not present

## 2016-04-13 DIAGNOSIS — Z833 Family history of diabetes mellitus: Secondary | ICD-10-CM | POA: Diagnosis not present

## 2016-04-13 DIAGNOSIS — E119 Type 2 diabetes mellitus without complications: Secondary | ICD-10-CM | POA: Diagnosis not present

## 2016-04-13 DIAGNOSIS — E890 Postprocedural hypothyroidism: Secondary | ICD-10-CM | POA: Diagnosis not present

## 2016-04-13 DIAGNOSIS — Z8249 Family history of ischemic heart disease and other diseases of the circulatory system: Secondary | ICD-10-CM | POA: Diagnosis not present

## 2016-04-13 DIAGNOSIS — Z85850 Personal history of malignant neoplasm of thyroid: Secondary | ICD-10-CM | POA: Diagnosis not present

## 2016-04-13 DIAGNOSIS — Z7951 Long term (current) use of inhaled steroids: Secondary | ICD-10-CM | POA: Diagnosis not present

## 2016-04-13 DIAGNOSIS — J45909 Unspecified asthma, uncomplicated: Secondary | ICD-10-CM | POA: Diagnosis not present

## 2016-04-13 DIAGNOSIS — R200 Anesthesia of skin: Secondary | ICD-10-CM | POA: Diagnosis not present

## 2016-04-13 DIAGNOSIS — Z881 Allergy status to other antibiotic agents status: Secondary | ICD-10-CM | POA: Diagnosis not present

## 2016-04-13 DIAGNOSIS — Z79899 Other long term (current) drug therapy: Secondary | ICD-10-CM | POA: Diagnosis not present

## 2016-04-28 ENCOUNTER — Ambulatory Visit (INDEPENDENT_AMBULATORY_CARE_PROVIDER_SITE_OTHER): Payer: BLUE CROSS/BLUE SHIELD | Admitting: Sports Medicine

## 2016-04-28 ENCOUNTER — Encounter: Payer: Self-pay | Admitting: Sports Medicine

## 2016-04-28 VITALS — BP 126/79 | HR 73 | Wt 165.0 lb

## 2016-04-28 DIAGNOSIS — H6991 Unspecified Eustachian tube disorder, right ear: Secondary | ICD-10-CM

## 2016-04-28 DIAGNOSIS — F458 Other somatoform disorders: Secondary | ICD-10-CM | POA: Insufficient documentation

## 2016-04-28 DIAGNOSIS — H6981 Other specified disorders of Eustachian tube, right ear: Secondary | ICD-10-CM | POA: Diagnosis not present

## 2016-04-28 DIAGNOSIS — H6980 Other specified disorders of Eustachian tube, unspecified ear: Secondary | ICD-10-CM | POA: Insufficient documentation

## 2016-04-28 DIAGNOSIS — E669 Obesity, unspecified: Secondary | ICD-10-CM

## 2016-04-28 MED ORDER — TOPIRAMATE 50 MG PO TABS: ORAL_TABLET | ORAL | Status: DC

## 2016-04-28 MED ORDER — PHENTERMINE HCL 37.5 MG PO TABS: ORAL_TABLET | ORAL | Status: DC

## 2016-04-28 NOTE — Assessment & Plan Note (Signed)
History of tympanostomy tube placement. Now with recurrence of eustachian tube dysfunction and ear fullness, not responding to intranasal steroids.  Patient will return to Dr. Idelle Crouch with ENT.

## 2016-04-28 NOTE — Assessment & Plan Note (Signed)
Refilling phentermine, we finished 4 months. Adding Topamax. She did not respond to Contrave, and she has a history of thyroid carcinoma so we have been avoiding Saxenda.

## 2016-04-28 NOTE — Progress Notes (Signed)
  Subjective:    CC: Follow-up  HPI: Obesity: Good continued weight loss after 4 months of phentermine  Eustachian dysfunction: Has a history of tubes in the ear, right-sided, has been using intranasal steroids without improvement. Agreeable to return to her ENT doctor consideration of repeat tube placement.  Scar pain: Excision of a benign lesion under left breast at a previous visit, she is having some discomfort when her brought rubs against it. She is agreeable to postpone this until the next visit  Past medical history, Surgical history, Family history not pertinant except as noted below, Social history, Allergies, and medications have been entered into the medical record, reviewed, and no changes needed.   Review of Systems: No fevers, chills, night sweats, weight loss, chest pain, or shortness of breath.   Objective:    General: Well Developed, well nourished, and in no acute distress.  Neuro: Alert and oriented x3, extra-ocular muscles intact, sensation grossly intact.  HEENT: Normocephalic, atraumatic, pupils equal round reactive to light, neck supple, no masses, no lymphadenopathy, thyroid nonpalpable.  Skin: Warm and dry, no rashes.Scar under left breast is well-healed, no signs of infection. Cardiac: Regular rate and rhythm, no murmurs rubs or gallops, no lower extremity edema.  Respiratory: Clear to auscultation bilaterally. Not using accessory muscles, speaking in full sentences.  Impression and Recommendations:   I spent 25 minutes with this patient, greater than 50% was face-to-face time counseling regarding the above diagnoses

## 2016-05-11 ENCOUNTER — Other Ambulatory Visit: Payer: Self-pay | Admitting: Sports Medicine

## 2016-05-26 ENCOUNTER — Encounter: Payer: Self-pay | Admitting: Sports Medicine

## 2016-05-26 ENCOUNTER — Ambulatory Visit (INDEPENDENT_AMBULATORY_CARE_PROVIDER_SITE_OTHER): Payer: BLUE CROSS/BLUE SHIELD | Admitting: Sports Medicine

## 2016-05-26 DIAGNOSIS — E669 Obesity, unspecified: Secondary | ICD-10-CM

## 2016-05-26 MED ORDER — LORCASERIN HCL ER 20 MG PO TB24: 1.0000 | ORAL_TABLET | Freq: Every day | ORAL | Status: DC

## 2016-05-26 MED ORDER — TOPIRAMATE 50 MG PO TABS: ORAL_TABLET | ORAL | Status: DC

## 2016-05-26 MED ORDER — PHENTERMINE HCL 37.5 MG PO TABS: ORAL_TABLET | ORAL | Status: DC

## 2016-05-26 NOTE — Assessment & Plan Note (Signed)
Refilling phentermine as we enter the sixth month, continue Topamax. She did not respond to Contrave, history of thyroid carcinoma so we have been avoiding Saxenda. Eventually we will come off the phentermine, this is the last month, so I am going to add Belviq XR

## 2016-05-26 NOTE — Progress Notes (Signed)
  Subjective:    CC: Weight check  HPI: Continued weight loss, doing well.  Past medical history, Surgical history, Family history not pertinant except as noted below, Social history, Allergies, and medications have been entered into the medical record, reviewed, and no changes needed.   Review of Systems: No fevers, chills, night sweats, weight loss, chest pain, or shortness of breath.   Objective:    General: Well Developed, well nourished, and in no acute distress.  Neuro: Alert and oriented x3, extra-ocular muscles intact, sensation grossly intact.  HEENT: Normocephalic, atraumatic, pupils equal round reactive to light, neck supple, no masses, no lymphadenopathy, thyroid nonpalpable.  Skin: Warm and dry, no rashes. Cardiac: Regular rate and rhythm, no murmurs rubs or gallops, no lower extremity edema.  Respiratory: Clear to auscultation bilaterally. Not using accessory muscles, speaking in full sentences.  Impression and Recommendations:    I spent 25 minutes with this patient, greater than 50% was face-to-face time counseling regarding the above diagnoses

## 2016-06-11 DIAGNOSIS — M961 Postlaminectomy syndrome, not elsewhere classified: Secondary | ICD-10-CM | POA: Diagnosis not present

## 2016-06-11 DIAGNOSIS — M5416 Radiculopathy, lumbar region: Secondary | ICD-10-CM | POA: Diagnosis not present

## 2016-06-11 DIAGNOSIS — Z6826 Body mass index (BMI) 26.0-26.9, adult: Secondary | ICD-10-CM | POA: Diagnosis not present

## 2016-06-11 DIAGNOSIS — G894 Chronic pain syndrome: Secondary | ICD-10-CM | POA: Diagnosis not present

## 2016-07-19 ENCOUNTER — Emergency Department (HOSPITAL_COMMUNITY): Payer: BLUE CROSS/BLUE SHIELD

## 2016-07-19 ENCOUNTER — Emergency Department (HOSPITAL_COMMUNITY)
Admission: EM | Admit: 2016-07-19 | Discharge: 2016-07-20 | Disposition: A | Discharge status: Home / Self Care | Payer: BLUE CROSS/BLUE SHIELD | Attending: Emergency Medicine | Admitting: Emergency Medicine

## 2016-07-19 ENCOUNTER — Encounter (HOSPITAL_COMMUNITY): Payer: Self-pay | Admitting: Emergency Medicine

## 2016-07-19 DIAGNOSIS — N898 Other specified noninflammatory disorders of vagina: Secondary | ICD-10-CM

## 2016-07-19 DIAGNOSIS — R102 Pelvic and perineal pain: Secondary | ICD-10-CM | POA: Insufficient documentation

## 2016-07-19 DIAGNOSIS — R52 Pain, unspecified: Secondary | ICD-10-CM

## 2016-07-19 DIAGNOSIS — E119 Type 2 diabetes mellitus without complications: Secondary | ICD-10-CM | POA: Insufficient documentation

## 2016-07-19 DIAGNOSIS — E039 Hypothyroidism, unspecified: Secondary | ICD-10-CM | POA: Insufficient documentation

## 2016-07-19 DIAGNOSIS — D259 Leiomyoma of uterus, unspecified: Secondary | ICD-10-CM | POA: Diagnosis not present

## 2016-07-19 DIAGNOSIS — I10 Essential (primary) hypertension: Secondary | ICD-10-CM | POA: Diagnosis not present

## 2016-07-19 DIAGNOSIS — N739 Female pelvic inflammatory disease, unspecified: Secondary | ICD-10-CM | POA: Diagnosis not present

## 2016-07-19 DIAGNOSIS — Z85850 Personal history of malignant neoplasm of thyroid: Secondary | ICD-10-CM | POA: Diagnosis not present

## 2016-07-19 DIAGNOSIS — Z7984 Long term (current) use of oral hypoglycemic drugs: Secondary | ICD-10-CM | POA: Diagnosis not present

## 2016-07-19 LAB — URINALYSIS, ROUTINE W REFLEX MICROSCOPIC
Glucose, UA: >1000 mg/dL — AB
Ketones, ur: 15 mg/dL — AB
Nitrite: NEGATIVE
Protein, ur: 30 mg/dL — AB
Specific Gravity, Urine: 1.038 — ABNORMAL HIGH (ref 1.005–1.030)
pH: 6.5 (ref 5.0–8.0)

## 2016-07-19 LAB — COMPREHENSIVE METABOLIC PANEL
ALT: 15 U/L (ref 14–54)
AST: 19 U/L (ref 15–41)
Albumin: 3.3 g/dL — ABNORMAL LOW (ref 3.5–5.0)
Alkaline Phosphatase: 80 U/L (ref 38–126)
Anion gap: 12 (ref 5–15)
BUN: 5 mg/dL — ABNORMAL LOW (ref 6–20)
CO2: 22 mmol/L (ref 22–32)
Calcium: 8.2 mg/dL — ABNORMAL LOW (ref 8.9–10.3)
Chloride: 101 mmol/L (ref 101–111)
Creatinine, Ser: 0.96 mg/dL (ref 0.44–1.00)
GFR calc Af Amer: >60 mL/min (ref >60)
GFR calc non Af Amer: >60 mL/min (ref >60)
Glucose, Bld: 115 mg/dL — ABNORMAL HIGH (ref 65–99)
Potassium: 3.5 mmol/L (ref 3.5–5.1)
Sodium: 135 mmol/L (ref 135–145)
Total Bilirubin: 0.4 mg/dL (ref 0.3–1.2)
Total Protein: 6.4 g/dL — ABNORMAL LOW (ref 6.5–8.1)

## 2016-07-19 LAB — I-STAT BETA HCG BLOOD, ED (MC, WL, AP ONLY): I-stat hCG, quantitative: <5 m[IU]/mL (ref <5)

## 2016-07-19 LAB — CBC
HCT: 38.9 % (ref 36.0–46.0)
Hemoglobin: 12.6 g/dL (ref 12.0–15.0)
MCH: 28.4 pg (ref 26.0–34.0)
MCHC: 32.4 g/dL (ref 30.0–36.0)
MCV: 87.8 fL (ref 78.0–100.0)
Platelets: 334 10*3/uL (ref 150–400)
RBC: 4.43 MIL/uL (ref 3.87–5.11)
RDW: 13.5 % (ref 11.5–15.5)
WBC: 13.9 10*3/uL — ABNORMAL HIGH (ref 4.0–10.5)

## 2016-07-19 LAB — URINE MICROSCOPIC-ADD ON

## 2016-07-19 LAB — WET PREP, GENITAL
Sperm: NONE SEEN
Trich, Wet Prep: NONE SEEN
Yeast Wet Prep HPF POC: NONE SEEN

## 2016-07-19 NOTE — ED Notes (Signed)
Refusing blood work.  Provider made aware.

## 2016-07-19 NOTE — ED Provider Notes (Signed)
[facility] DEPT Provider Note   CSN: [id number] Arrival date & time: 07/19/16  1736     History   Chief Complaint Chief Complaint  Patient presents with  . Abdominal Pain  . Vaginal Discharge    HPI Nancy Poole is a 50 y.o. female.  HPI   Patient is a 50 year old female with a history of DM, HTN, thyroid cancer, hematuria presents to the emergency department with 9 days of vaginal discharge and one day of suprapubic pain. Vaginal discharge is described as pink and clear, with a foul odor. It is intermittent. Suprapubic pain is constant, pressure/burning sensation intermittently stabbing with walking or movement, 8/10, she is taking Norco with no relief. Last menstrual period was August 24. Patient is on oral contraceptive pills. Associated nausea. Patient also endorses darker than normal stools for the past 2 weeks. She denies black or tarry stools, vomiting, dysuria, hematuria, headaches, dizziness, chest pain, shortness of breath.  Past Medical History:  Diagnosis Date  . Cancer ([place])   . Diabetes mellitus without complication ([place])   . Herniated disc   . Hypertension     Patient Active Problem List   Diagnosis Date Noted  . Eustachian tube dysfunction 04/28/2016  . Calcium deficiency disease 01/29/2015  . Hypoparathyroidism ([place]) 01/29/2015  . Blood in the urine 07/31/2014  . Incomplete bladder emptying 07/31/2014  . Hypothyroidism 07/24/2014  . Diabetes mellitus, type 2 ([place]) 03/07/2014  . Microcytic anemia 03/07/2014  . Obesity 01/03/2014  . Hypertension 12/20/2013  . Insomnia 12/20/2013  . Right lumbar radiculitis 11/13/2013  . Nerve root inflammation 11/13/2013  . GERD (gastroesophageal reflux disease) 10/23/2013  . Preventive measure 10/23/2013  . Bilateral plantar fasciitis 10/03/2013  . HLD (hyperlipidemia) 09/11/2013  . Plantar fasciitis 02/10/2013  . Lung nodule, solitary 08/24/2012  . CD (conductive deafness) 01/20/2011  . Otoporosis 08/12/2010    . Primary papillary carcinoma of thyroid, follicular subtype, post thyroidectomy 03/17/2010    Past Surgical History:  Procedure Laterality Date  . THYROIDECTOMY      OB History    No data available       Home Medications    Prior to Admission medications   Medication Sig Start Date End Date Taking? Authorizing Provider  amLODipine (NORVASC) 5 MG tablet Take 1 tablet (5 mg total) by mouth daily. 12/20/14   Silverio Decamp, MD  azithromycin (ZITHROMAX) 250 MG tablet Take 4 tablets (1,000 mg total) by mouth daily. Take on 07/27/2016 07/20/16   Delos Haring, PA-C  Blood Glucose Monitoring Suppl (FIFTY50 GLUCOSE METER 2.0) W/DEVICE KIT Frequency:PHARMDIR   Dosage:0.0     Instructions:  Note:lancets, strips and 1 glucometer, for testing up to 3 times per day with meals and at HS. DX 250.00. Dose: SEE BELOW 12/01/12   Historical Provider, MD  Calcium Carb-Cholecalciferol (CALCIUM 1000 + D PO) Take by mouth.    Historical Provider, MD  Dapagliflozin-Metformin HCl ER (XIGDUO XR) 08-999 MG TB24 Take 1 tablet by mouth daily. 02/11/16   Silverio Decamp, MD  diazepam (VALIUM) 5 MG tablet Take 5 mg by mouth 2 (two) times daily as needed. anxiety 01/07/15   Historical Provider, MD  ferrous sulfate (FER-IN-SOL) 75 (15 FE) MG/ML SOLN Take 1 mL (15 mg of iron total) by mouth daily. 01/11/15   Al Corpus, PA-C  fluconazole (DIFLUCAN) 200 MG tablet Take 1 tablet (200 mg total) by mouth daily. If symptoms have not resolved take another tablet in 72 hours 07/20/16 07/22/16  Delos Haring, PA-C  fluticasone (FLONASE) 50 MCG/ACT nasal spray Two spray in each nostril twice a day, use left hand for right nostril, and right hand for left nostril. 01/14/16   Silverio Decamp, MD  levonorgestrel-ethinyl estradiol Patty Sermons) 0.1-20 MG-MCG tablet  12/06/14   Historical Provider, MD  levothyroxine (SYNTHROID, LEVOTHROID) 150 MCG tablet TAKE 1 TABLET BY MOUTH EVERY DAY BEFORE BREAKFAST 12/20/14    Silverio Decamp, MD  liothyronine (CYTOMEL) 5 MCG tablet Take 5 mcg by mouth daily.    Historical Provider, MD  Lorcaserin HCl ER (BELVIQ XR) 20 MG TB24 Take 1 tablet by mouth daily. 05/26/16   Silverio Decamp, MD  metroNIDAZOLE (FLAGYL) 500 MG tablet Take 1 tablet (500 mg total) by mouth 2 (two) times daily. For 14 days 07/20/16   Kalman Drape, PA  omeprazole (PRILOSEC) 40 MG capsule TAKE 1 CAPSULE DAILY 05/11/16   Silverio Decamp, MD  oxyCODONE-acetaminophen (PERCOCET) 7.5-325 MG per tablet Take by mouth. 01/25/15   Historical Provider, MD  phentermine (ADIPEX-P) 37.5 MG tablet One tab by mouth qAM 05/26/16   Silverio Decamp, MD  topiramate (TOPAMAX) 50 MG tablet One half tab by mouth daily for a week, then one tab by mouth daily. 05/26/16   Silverio Decamp, MD  traZODone (DESYREL) 50 MG tablet Take 100 mg by mouth at bedtime.  07/24/14   Historical Provider, MD  zolpidem (AMBIEN CR) 6.25 MG CR tablet Take 1 tablet (6.25 mg total) by mouth at bedtime as needed for sleep. 12/28/14   Silverio Decamp, MD    Family History History reviewed. No pertinent family history.  Social History Social History  Substance Use Topics  . Smoking status: Never Smoker  . Smokeless tobacco: Not on file  . Alcohol use No     Allergies   Pregabalin and Tetracyclines & related   Review of Systems Review of Systems  Constitutional: Positive for chills. Negative for fatigue and fever.  Respiratory: Negative for chest tightness and shortness of breath.   Cardiovascular: Negative for chest pain.  Gastrointestinal: Positive for nausea. Negative for abdominal pain, blood in stool, diarrhea and vomiting.  Genitourinary: Positive for pelvic pain and vaginal discharge. Negative for difficulty urinating, dyspareunia, dysuria, flank pain, hematuria, vaginal bleeding and vaginal pain.  Musculoskeletal: Negative for back pain and neck pain.  Skin: Negative for rash.  Neurological:  Negative for dizziness, syncope and headaches.     Physical Exam Updated Vital Signs BP 120/86   Pulse 85   Temp 98.1 F (36.7 C) (Oral)   Resp 20   Ht 5' 6.5" (1.689 m)   Wt 76.2 kg   SpO2 99%   BMI 26.71 kg/m   Physical Exam  Constitutional: She appears well-developed and well-nourished. No distress.  HENT:  Head: Normocephalic and atraumatic.  Eyes: Conjunctivae are normal.  Cardiovascular: Normal rate, regular rhythm and normal heart sounds.  Exam reveals no gallop and no friction rub.   No murmur heard. Pulmonary/Chest: Effort normal and breath sounds normal. No respiratory distress. She has no wheezes. She has no rales.  Abdominal: Soft. Bowel sounds are normal. She exhibits no distension. There is tenderness in the suprapubic area. There is no rigidity, no guarding, no CVA tenderness and no tenderness at McBurney's point.  Genitourinary:  Genitourinary Comments: Exam performed by Kalman Drape,  exam chaperoned Date: 07/19/2016 Pelvic exam: normal external genitalia without evidence of trauma. VULVA: normal appearing vulva with no masses, tenderness or lesion. VAGINA: normal appearing vagina  with normal color and discharge, no lesions. CERVIX: normal appearing cervix without lesions, cervical motion tenderness absent, cervical os closed with out purulent discharge; vaginal discharge - copious, clear/brown, malodorous, thin. Wet prep and DNA probe for chlamydia and GC obtained.   ADNEXA: normal adnexa in size, tender on the left and nontender on the right and no masses UTERUS: uterus is normal size, shape, consistency and nontender.    Musculoskeletal: Normal range of motion.  Neurological: She is alert. Coordination normal.  Skin: Skin is warm and dry. She is not diaphoretic.  Psychiatric: She has a normal mood and affect. Her behavior is normal.  Nursing note and vitals reviewed.    ED Treatments / Results  Labs (all labs ordered are listed, but only abnormal  results are displayed) Labs Reviewed  WET PREP, GENITAL - Abnormal; Notable for the following:       Result Value   Clue Cells Wet Prep HPF POC PRESENT (*)    WBC, Wet Prep HPF POC MANY (*)    All other components within normal limits  COMPREHENSIVE METABOLIC PANEL - Abnormal; Notable for the following:    Glucose, Bld 115 (*)    BUN 5 (*)    Calcium 8.2 (*)    Total Protein 6.4 (*)    Albumin 3.3 (*)    All other components within normal limits  CBC - Abnormal; Notable for the following:    WBC 13.9 (*)    All other components within normal limits  URINALYSIS, ROUTINE W REFLEX MICROSCOPIC (NOT AT [facility]) - Abnormal; Notable for the following:    Color, Urine AMBER (*)    Specific Gravity, Urine 1.038 (*)    Glucose, UA >1000 (*)    Hgb urine dipstick MODERATE (*)    Bilirubin Urine SMALL (*)    Ketones, ur 15 (*)    Protein, ur 30 (*)    Leukocytes, UA SMALL (*)    All other components within normal limits  URINE MICROSCOPIC-ADD ON - Abnormal; Notable for the following:    Squamous Epithelial / LPF 0-5 (*)    Bacteria, UA RARE (*)    All other components within normal limits  I-STAT BETA HCG BLOOD, ED ([facility], [facility], [facility] ONLY)  GC/CHLAMYDIA PROBE AMP ([place]) NOT AT [facility]    EKG  EKG Interpretation None       Radiology No results found.   US Pelvis complete  CLINICAL DATA: Right pelvic pain. Vaginal discharge for 10 days.    EXAM:  TRANSABDOMINAL AND TRANSVAGINAL ULTRASOUND OF PELVIS    TECHNIQUE:  Both transabdominal and transvaginal ultrasound examinations of the  pelvis were performed. Transabdominal technique was performed for  global imaging of the pelvis including uterus, ovaries, adnexal  regions, and pelvic cul-de-sac. It was necessary to proceed with  endovaginal exam following the transabdominal exam to visualize the  left ovary.    COMPARISON: None    FINDINGS:  Uterus    Measurements: 8.2 x 5.7 x 5.5 cm. Two myometrial masses are  present,  likely fibroids. 1 measures 2.9 cm in the anterior fundal region and  the other measures 3.9 cm in the low posterior myometrium.    Endometrium    Thickness: 4.8 mm. No discrete mass or polyp. Moderate fluid or  blood in the endometrial canal.    Right ovary    Not visible.    Left ovary    Measurements: 4.0 x 3.5 x 4.2 cm. Tubular structure with complex  fluid in the left adnexal region, approximately 1.5 cm in  cross-section, suspicious for hydrosalpinx or pyosalpinx.    Other findings    Small volume free pelvic fluid.    IMPRESSION:  1. Tubular structure in the left adnexal region, containing complex  fluid. Suspicious for hydrosalpinx or pyosalpinx.  2. At least 2 uterine fibroids measuring 3-4 cm.  3. Nonvisualization of the right ovary.      Electronically Signed  By: Andreas Newport M.D.  On: 07/20/2016 01:08     Procedures Procedures (including critical care time)  Medications Ordered in ED Medications  cefTRIAXone (ROCEPHIN) injection 250 mg (250 mg Intramuscular Given 07/20/16 0136)  azithromycin (ZITHROMAX) powder 1 g (1 g Oral Given 07/20/16 0136)  lidocaine (PF) (XYLOCAINE) 1 % injection 2 mL (2 mLs Other Given 07/20/16 0139)     Initial Impression / Assessment and Plan / ED Course  I have reviewed the triage vital signs and the nursing notes.  Pertinent labs & imaging results that were available during my care of the patient were reviewed by me and considered in my medical decision making (see chart for details).  Clinical Course   Pt with vaginal discharge and pelvic pain. Wet prep revealed clue cells and WBC's. Mild leukocytosis. PT tender on bimanual exam so [place] was orders. US revealed possible hyrosalpinx or pyosalpinx. Likely the source of her pain and discharge. Will treat as PID. Pt allergic to tetracyclines. Will administer IM Rocephin and give 1g of Azithromycin in the ED and d/c with flagyl and a 1g dose of  azithromycin for pt to take in 7 days.   UA revealed blood which is likely a contaminate as pt has vaginal bleeding. Rare bacteria and pt is asymptotic at this time so will not treat. Pt refused HIV and RPR.   Pt has an appointment with her OB/GYN on the 15th. I instructed the pt to keep this appointment and be seen on the 15th. I discussed strict return precautions. Pt expressed understanding to the discharge instructions.   Pt case discussed with Dr. Ashok Cordia who agrees with the above plan.  Final Clinical Impressions(s) / ED Diagnoses   Final diagnoses:  Pain  PID (pelvic inflammatory disease)  Vaginal discharge    New Prescriptions Discharge Medication List as of 07/20/2016  1:32 AM    START taking these medications   Details  metroNIDAZOLE (FLAGYL) 500 MG tablet Take 1 tablet (500 mg total) by mouth 2 (two) times daily. For 14 days, Starting Mon 07/20/2016, Print    azithromycin (ZITHROMAX) 250 MG tablet Take 4 tablets (1,000 mg total) by mouth daily. Take on 07/27/2016, Starting Mon 07/20/2016, Print    fluconazole (DIFLUCAN) 200 MG tablet Take 1 tablet (200 mg total) by mouth daily. If symptoms have not resolved take another tablet in 72 hours, Starting Mon 07/20/2016, Until Wed 07/22/2016, [place], [place] 07/22/16 1418    Lajean Saver, MD 07/24/16 1345

## 2016-07-19 NOTE — ED Triage Notes (Signed)
Pt sts lower abd pain and pink foul smelling vaginal discharge x 10 days

## 2016-07-20 LAB — GC/CHLAMYDIA PROBE AMP (~~LOC~~) NOT AT ARMC
Chlamydia: NEGATIVE
Neisseria Gonorrhea: NEGATIVE

## 2016-07-20 MED ORDER — METRONIDAZOLE 500 MG PO TABS: 500.0000 mg | ORAL_TABLET | Freq: Two times a day (BID) | ORAL | 0 refills | Status: DC

## 2016-07-20 MED ORDER — AZITHROMYCIN 250 MG PO TABS: 1000.0000 mg | ORAL_TABLET | Freq: Every day | ORAL | 0 refills | Status: DC

## 2016-07-20 MED ORDER — FLUCONAZOLE 200 MG PO TABS: 200.0000 mg | ORAL_TABLET | Freq: Every day | ORAL | 0 refills | Status: AC

## 2016-07-20 MED ORDER — CEFTRIAXONE SODIUM 250 MG IJ SOLR
250.0000 mg | Freq: Once | INTRAMUSCULAR | Status: AC
  Administered 2016-07-20: 250 mg via INTRAMUSCULAR
  Filled 2016-07-20: qty 250

## 2016-07-20 MED ORDER — LIDOCAINE HCL (PF) 1 % IJ SOLN
INTRAMUSCULAR | Status: AC
  Filled 2016-07-20: qty 5

## 2016-07-20 MED ORDER — LIDOCAINE HCL (PF) 1 % IJ SOLN
2.0000 mL | Freq: Once | INTRAMUSCULAR | Status: AC
  Administered 2016-07-20: 2 mL

## 2016-07-20 MED ORDER — AZITHROMYCIN 1 G PO PACK
1.0000 g | PACK | Freq: Once | ORAL | Status: AC
  Administered 2016-07-20: 1 g via ORAL
  Filled 2016-07-20: qty 1

## 2016-07-20 MED ORDER — FLUCONAZOLE 200 MG PO TABS: 200.0000 mg | ORAL_TABLET | Freq: Every day | ORAL | 0 refills | Status: DC

## 2016-07-20 NOTE — Discharge Instructions (Signed)
Your ultrasound was suspicious for hydrosalpinx. Take the antibiotics as prescribed and be sure to complete the entire course. Avoid the sun while taking doxycycline. Keep your appointment with your OB/GYN.  Return to emergency Department if you experience worsening discharge, worsening abdominal pain, fever, chest pain, shortness of breath or any other concerning symptoms.

## 2016-07-24 DIAGNOSIS — R102 Pelvic and perineal pain: Secondary | ICD-10-CM | POA: Diagnosis not present

## 2016-07-24 DIAGNOSIS — Z6825 Body mass index (BMI) 25.0-25.9, adult: Secondary | ICD-10-CM | POA: Diagnosis not present

## 2016-07-24 DIAGNOSIS — Z124 Encounter for screening for malignant neoplasm of cervix: Secondary | ICD-10-CM | POA: Diagnosis not present

## 2016-07-24 DIAGNOSIS — Z1239 Encounter for other screening for malignant neoplasm of breast: Secondary | ICD-10-CM | POA: Diagnosis not present

## 2016-07-24 DIAGNOSIS — Z01419 Encounter for gynecological examination (general) (routine) without abnormal findings: Secondary | ICD-10-CM | POA: Diagnosis not present

## 2016-08-01 ENCOUNTER — Other Ambulatory Visit: Payer: Self-pay | Admitting: Obstetrics and Gynecology

## 2016-08-01 DIAGNOSIS — Z1231 Encounter for screening mammogram for malignant neoplasm of breast: Secondary | ICD-10-CM

## 2016-08-03 ENCOUNTER — Other Ambulatory Visit: Payer: Self-pay | Admitting: Sports Medicine

## 2016-08-03 DIAGNOSIS — E669 Obesity, unspecified: Secondary | ICD-10-CM

## 2016-08-06 ENCOUNTER — Ambulatory Visit (INDEPENDENT_AMBULATORY_CARE_PROVIDER_SITE_OTHER): Payer: BLUE CROSS/BLUE SHIELD | Admitting: Sports Medicine

## 2016-08-06 DIAGNOSIS — E119 Type 2 diabetes mellitus without complications: Secondary | ICD-10-CM

## 2016-08-06 DIAGNOSIS — Z299 Encounter for prophylactic measures, unspecified: Secondary | ICD-10-CM

## 2016-08-06 DIAGNOSIS — E669 Obesity, unspecified: Secondary | ICD-10-CM | POA: Diagnosis not present

## 2016-08-06 LAB — POCT GLYCOSYLATED HEMOGLOBIN (HGB A1C): Hemoglobin A1C: 6.1

## 2016-08-06 MED ORDER — LORCASERIN HCL ER 20 MG PO TB24: 1.0000 | ORAL_TABLET | Freq: Every day | ORAL | 3 refills | Status: DC

## 2016-08-06 MED ORDER — TOPIRAMATE 50 MG PO TABS: ORAL_TABLET | ORAL | 3 refills | Status: DC

## 2016-08-06 NOTE — Assessment & Plan Note (Signed)
Discontinue phentermine, we have finished 6 months and she hasn't taken in a week. Refilling Belviq and Topamax with 3 month supplies. Return to see me in 3 months.

## 2016-08-06 NOTE — Assessment & Plan Note (Signed)
Rechecking hemoglobin A1c, diabetic foot exam, referral for eye exam.

## 2016-08-06 NOTE — Assessment & Plan Note (Signed)
ColoGuard ordered, mammogram ordered.

## 2016-08-06 NOTE — Progress Notes (Signed)
  Subjective:    CC: Follow-up  HPI: Obesity: Good continued weight loss after 6 months of phentermine, Belviq also seems to be working quite well.  Preventive measures: Due for mammogram, colon cancer screening.  Diabetes mellitus type 2: Due for hemoglobin A1c, foot exam, eye exam.  Past medical history:  Negative.  See flowsheet/record as well for more information.  Surgical history: Negative.  See flowsheet/record as well for more information.  Family history: Negative.  See flowsheet/record as well for more information.  Social history: Negative.  See flowsheet/record as well for more information.  Allergies, and medications have been entered into the medical record, reviewed, and no changes needed.   Review of Systems: No fevers, chills, night sweats, weight loss, chest pain, or shortness of breath.   Objective:    General: Well Developed, well nourished, and in no acute distress.  Neuro: Alert and oriented x3, extra-ocular muscles intact, sensation grossly intact.  HEENT: Normocephalic, atraumatic, pupils equal round reactive to light, neck supple, no masses, no lymphadenopathy, thyroid nonpalpable.  Skin: Warm and dry, no rashes. Cardiac: Regular rate and rhythm, no murmurs rubs or gallops, no lower extremity edema.  Respiratory: Clear to auscultation bilaterally. Not using accessory muscles, speaking in full sentences.  Diabetic Foot Exam Both feet were examined, there are no signs of ulceration or abnormal callus. Nails are unremarkable. Dorsalis pedis and posterior tibial pulses are palpable. Sensation is intact to sharp and monofilament. Shoes are of appropriate fitment.  Impression and Recommendations:    Obesity Discontinue phentermine, we have finished 6 months and she hasn't taken in a week. Refilling Belviq and Topamax with 3 month supplies. Return to see me in 3 months.  Diabetes mellitus, type 2 Rechecking hemoglobin A1c, diabetic foot exam, referral for  eye exam.   Preventive measure ColoGuard ordered, mammogram ordered.  I spent 40 minutes with this patient, greater than 50% was face-to-face time counseling regarding the above diagnoses

## 2016-08-10 ENCOUNTER — Ambulatory Visit: Payer: BLUE CROSS/BLUE SHIELD

## 2016-08-18 DIAGNOSIS — N7011 Chronic salpingitis: Secondary | ICD-10-CM | POA: Diagnosis not present

## 2016-08-18 DIAGNOSIS — R102 Pelvic and perineal pain: Secondary | ICD-10-CM | POA: Diagnosis not present

## 2016-08-18 DIAGNOSIS — D251 Intramural leiomyoma of uterus: Secondary | ICD-10-CM | POA: Diagnosis not present

## 2016-08-18 DIAGNOSIS — D252 Subserosal leiomyoma of uterus: Secondary | ICD-10-CM | POA: Diagnosis not present

## 2016-08-18 DIAGNOSIS — Z6825 Body mass index (BMI) 25.0-25.9, adult: Secondary | ICD-10-CM | POA: Diagnosis not present

## 2016-08-21 ENCOUNTER — Other Ambulatory Visit: Payer: Self-pay | Admitting: Sports Medicine

## 2016-08-21 DIAGNOSIS — I10 Essential (primary) hypertension: Secondary | ICD-10-CM

## 2016-09-06 ENCOUNTER — Other Ambulatory Visit: Payer: Self-pay | Admitting: Sports Medicine

## 2016-09-06 DIAGNOSIS — E669 Obesity, unspecified: Secondary | ICD-10-CM

## 2016-09-09 ENCOUNTER — Other Ambulatory Visit: Payer: Self-pay | Admitting: Sports Medicine

## 2016-09-09 DIAGNOSIS — E669 Obesity, unspecified: Secondary | ICD-10-CM

## 2016-10-12 DIAGNOSIS — E892 Postprocedural hypoparathyroidism: Secondary | ICD-10-CM | POA: Diagnosis not present

## 2016-10-12 DIAGNOSIS — J45909 Unspecified asthma, uncomplicated: Secondary | ICD-10-CM | POA: Diagnosis not present

## 2016-10-12 DIAGNOSIS — C73 Malignant neoplasm of thyroid gland: Secondary | ICD-10-CM | POA: Diagnosis not present

## 2016-10-12 DIAGNOSIS — Z79899 Other long term (current) drug therapy: Secondary | ICD-10-CM | POA: Diagnosis not present

## 2016-10-12 DIAGNOSIS — E119 Type 2 diabetes mellitus without complications: Secondary | ICD-10-CM | POA: Diagnosis not present

## 2016-10-12 DIAGNOSIS — E890 Postprocedural hypothyroidism: Secondary | ICD-10-CM | POA: Diagnosis not present

## 2016-10-14 ENCOUNTER — Ambulatory Visit (INDEPENDENT_AMBULATORY_CARE_PROVIDER_SITE_OTHER): Payer: BLUE CROSS/BLUE SHIELD

## 2016-10-14 DIAGNOSIS — Z23 Encounter for immunization: Secondary | ICD-10-CM

## 2016-10-19 DIAGNOSIS — Z1231 Encounter for screening mammogram for malignant neoplasm of breast: Secondary | ICD-10-CM | POA: Diagnosis not present

## 2016-12-16 ENCOUNTER — Encounter: Payer: Self-pay | Admitting: Sports Medicine

## 2016-12-16 ENCOUNTER — Ambulatory Visit (INDEPENDENT_AMBULATORY_CARE_PROVIDER_SITE_OTHER): Payer: BLUE CROSS/BLUE SHIELD | Admitting: Sports Medicine

## 2016-12-16 DIAGNOSIS — S21131A Puncture wound without foreign body of right front wall of thorax without penetration into thoracic cavity, initial encounter: Secondary | ICD-10-CM | POA: Diagnosis not present

## 2016-12-16 DIAGNOSIS — E6609 Other obesity due to excess calories: Secondary | ICD-10-CM | POA: Diagnosis not present

## 2016-12-16 DIAGNOSIS — E119 Type 2 diabetes mellitus without complications: Secondary | ICD-10-CM | POA: Diagnosis not present

## 2016-12-16 MED ORDER — LORCASERIN HCL ER 20 MG PO TB24: 1.0000 | ORAL_TABLET | Freq: Every day | ORAL | 0 refills | Status: DC

## 2016-12-16 MED ORDER — TOPIRAMATE 50 MG PO TABS: ORAL_TABLET | ORAL | 3 refills | Status: DC

## 2016-12-16 MED ORDER — PHENTERMINE HCL 15 MG PO CAPS: 15.0000 mg | ORAL_CAPSULE | ORAL | 0 refills | Status: DC

## 2016-12-16 NOTE — Assessment & Plan Note (Signed)
Good continued stability of weight on Belviq and topamax. I am agreeable to put her back on low-dose phentermine. Return in 3 months.

## 2016-12-16 NOTE — Assessment & Plan Note (Signed)
Pain over the right first and second rib after a work injury.

## 2016-12-16 NOTE — Progress Notes (Signed)
  Subjective:    CC:  Follow-up  HPI: Diabetes mellitus type 2: Well controlled, needs some screening measures.  Obesity: Has stabilized, would like to go back on low-dose phentermine.  Injury at work: Had a stack of cups following her right shoulder, just inferior to the clavicle, has had pain, numbness going down her right arm. No imaging studies done yet.  Past medical history:  Negative.  See flowsheet/record as well for more information.  Surgical history: Negative.  See flowsheet/record as well for more information.  Family history: Negative.  See flowsheet/record as well for more information.  Social history: Negative.  See flowsheet/record as well for more information.  Allergies, and medications have been entered into the medical record, reviewed, and no changes needed.   Review of Systems: No fevers, chills, night sweats, weight loss, chest pain, or shortness of breath.   Objective:    General: Well Developed, well nourished, and in no acute distress.  Neuro: Alert and oriented x3, extra-ocular muscles intact, sensation grossly intact.  HEENT: Normocephalic, atraumatic, pupils equal round reactive to light, neck supple, no masses, no lymphadenopathy, thyroid nonpalpable.  Skin: Warm and dry, no rashes. Cardiac: Regular rate and rhythm, no murmurs rubs or gallops, no lower extremity edema.  Respiratory: Clear to auscultation bilaterally. Not using accessory muscles, speaking in full sentences.  Impression and Recommendations:    Diabetes mellitus, type 2 Well-controlled on current medications, still due for urine microalbumin and diabetic eye exam.  Obesity Good continued stability of weight on Belviq and topamax. I am agreeable to put her back on low-dose phentermine. Return in 3 months.   Chest trauma, penetrating, right, initial encounter Pain over the right first and second rib after a work injury.

## 2016-12-16 NOTE — Assessment & Plan Note (Signed)
Well-controlled on current medications, still due for urine microalbumin and diabetic eye exam.

## 2016-12-17 LAB — MICROALBUMIN / CREATININE URINE RATIO
Creatinine, Urine: 125 mg/dL (ref 20–320)
Microalb Creat Ratio: 4 mcg/mg creat (ref <30)
Microalb, Ur: 0.5 mg/dL

## 2017-01-28 DIAGNOSIS — E119 Type 2 diabetes mellitus without complications: Secondary | ICD-10-CM | POA: Diagnosis not present

## 2017-01-28 DIAGNOSIS — E890 Postprocedural hypothyroidism: Secondary | ICD-10-CM | POA: Diagnosis not present

## 2017-01-28 DIAGNOSIS — Z6824 Body mass index (BMI) 24.0-24.9, adult: Secondary | ICD-10-CM | POA: Diagnosis not present

## 2017-01-28 DIAGNOSIS — J45909 Unspecified asthma, uncomplicated: Secondary | ICD-10-CM | POA: Diagnosis not present

## 2017-01-28 DIAGNOSIS — L905 Scar conditions and fibrosis of skin: Secondary | ICD-10-CM | POA: Diagnosis not present

## 2017-01-28 DIAGNOSIS — Z833 Family history of diabetes mellitus: Secondary | ICD-10-CM | POA: Diagnosis not present

## 2017-01-28 DIAGNOSIS — E65 Localized adiposity: Secondary | ICD-10-CM | POA: Diagnosis not present

## 2017-01-28 DIAGNOSIS — Z7984 Long term (current) use of oral hypoglycemic drugs: Secondary | ICD-10-CM | POA: Diagnosis not present

## 2017-01-28 DIAGNOSIS — Z79899 Other long term (current) drug therapy: Secondary | ICD-10-CM | POA: Diagnosis not present

## 2017-01-28 DIAGNOSIS — Q782 Osteopetrosis: Secondary | ICD-10-CM | POA: Diagnosis not present

## 2017-01-28 DIAGNOSIS — Z85850 Personal history of malignant neoplasm of thyroid: Secondary | ICD-10-CM | POA: Diagnosis not present

## 2017-01-28 DIAGNOSIS — C73 Malignant neoplasm of thyroid gland: Secondary | ICD-10-CM | POA: Diagnosis not present

## 2017-01-28 DIAGNOSIS — R29898 Other symptoms and signs involving the musculoskeletal system: Secondary | ICD-10-CM | POA: Diagnosis not present

## 2017-02-11 ENCOUNTER — Encounter: Payer: Self-pay | Admitting: Sports Medicine

## 2017-02-11 ENCOUNTER — Ambulatory Visit (INDEPENDENT_AMBULATORY_CARE_PROVIDER_SITE_OTHER): Payer: BLUE CROSS/BLUE SHIELD | Admitting: Sports Medicine

## 2017-02-11 DIAGNOSIS — I10 Essential (primary) hypertension: Secondary | ICD-10-CM

## 2017-02-11 DIAGNOSIS — E6609 Other obesity due to excess calories: Secondary | ICD-10-CM | POA: Diagnosis not present

## 2017-02-11 DIAGNOSIS — H6983 Other specified disorders of Eustachian tube, bilateral: Secondary | ICD-10-CM | POA: Diagnosis not present

## 2017-02-11 DIAGNOSIS — E119 Type 2 diabetes mellitus without complications: Secondary | ICD-10-CM | POA: Diagnosis not present

## 2017-02-11 DIAGNOSIS — Z0000 Encounter for general adult medical examination without abnormal findings: Secondary | ICD-10-CM

## 2017-02-11 MED ORDER — OMEPRAZOLE 40 MG PO CPDR: 40.0000 mg | DELAYED_RELEASE_CAPSULE | Freq: Every day | ORAL | 3 refills | Status: DC

## 2017-02-11 MED ORDER — LORCASERIN HCL ER 20 MG PO TB24: 1.0000 | ORAL_TABLET | Freq: Every day | ORAL | 3 refills | Status: DC

## 2017-02-11 MED ORDER — AMLODIPINE BESYLATE 5 MG PO TABS: 5.0000 mg | ORAL_TABLET | Freq: Every day | ORAL | 3 refills | Status: DC

## 2017-02-11 MED ORDER — TOPIRAMATE 50 MG PO TABS: ORAL_TABLET | ORAL | 3 refills | Status: DC

## 2017-02-11 MED ORDER — FLUTICASONE PROPIONATE 50 MCG/ACT NA SUSP: NASAL | 3 refills | Status: DC

## 2017-02-11 MED ORDER — DAPAGLIFLOZIN PRO-METFORMIN ER 10-1000 MG PO TB24: 1.0000 | ORAL_TABLET | Freq: Every day | ORAL | 3 refills | Status: DC

## 2017-02-11 MED ORDER — NONFORMULARY OR COMPOUNDED ITEM: 99 refills | Status: AC

## 2017-02-11 NOTE — Progress Notes (Signed)
  Subjective:    CC: Follow-up  HPI: Diabetes mellitus type 2: Controlled.  Hypertension: Controlled.  Hyperlipidemia: Stable.  Obesity: Doing well on Belviq, and low-dose phentermine.  Migraines: Has not had a single additional migraine since starting Topamax.  Past medical history:  Negative.  See flowsheet/record as well for more information.  Surgical history: Negative.  See flowsheet/record as well for more information.  Family history: Negative.  See flowsheet/record as well for more information.  Social history: Negative.  See flowsheet/record as well for more information.  Allergies, and medications have been entered into the medical record, reviewed, and no changes needed.   Review of Systems: No fevers, chills, night sweats, weight loss, chest pain, or shortness of breath.   Objective:    General: Well Developed, well nourished, and in no acute distress.  Neuro: Alert and oriented x3, extra-ocular muscles intact, sensation grossly intact.  HEENT: Normocephalic, atraumatic, pupils equal round reactive to light, neck supple, no masses, no lymphadenopathy, thyroid nonpalpable.  Skin: Warm and dry, no rashes. Cardiac: Regular rate and rhythm, no murmurs rubs or gallops, no lower extremity edema.  Respiratory: Clear to auscultation bilaterally. Not using accessory muscles, speaking in full sentences.  Impression and Recommendations:    Annual physical exam Awaiting diabetic eye exam and ColoGuard kit.  Diabetes mellitus, type 2 Controlled, refilling medications  Obesity Doing extremely well, continue current medications including half dose phentermine which I am happy to refill when she runs out.  I spent 25 minutes with this patient, greater than 50% was face-to-face time counseling regarding the above diagnoses

## 2017-02-11 NOTE — Assessment & Plan Note (Signed)
Controlled, refilling medications

## 2017-02-11 NOTE — Assessment & Plan Note (Signed)
Awaiting diabetic eye exam and ColoGuard kit.

## 2017-02-11 NOTE — Addendum Note (Signed)
Addended by: Elizabeth Sauer on: 02/11/2017 03:04 PM   Modules accepted: Orders

## 2017-02-11 NOTE — Assessment & Plan Note (Signed)
Doing extremely well, continue current medications including half dose phentermine which I am happy to refill when she runs out.

## 2017-02-15 DIAGNOSIS — G894 Chronic pain syndrome: Secondary | ICD-10-CM | POA: Diagnosis not present

## 2017-02-15 DIAGNOSIS — M961 Postlaminectomy syndrome, not elsewhere classified: Secondary | ICD-10-CM | POA: Diagnosis not present

## 2017-02-15 DIAGNOSIS — M5416 Radiculopathy, lumbar region: Secondary | ICD-10-CM | POA: Diagnosis not present

## 2017-02-22 DIAGNOSIS — Z6824 Body mass index (BMI) 24.0-24.9, adult: Secondary | ICD-10-CM | POA: Diagnosis not present

## 2017-02-22 DIAGNOSIS — H6121 Impacted cerumen, right ear: Secondary | ICD-10-CM | POA: Diagnosis not present

## 2017-02-25 DIAGNOSIS — Z6824 Body mass index (BMI) 24.0-24.9, adult: Secondary | ICD-10-CM | POA: Diagnosis not present

## 2017-02-25 DIAGNOSIS — N898 Other specified noninflammatory disorders of vagina: Secondary | ICD-10-CM | POA: Diagnosis not present

## 2017-02-25 DIAGNOSIS — N952 Postmenopausal atrophic vaginitis: Secondary | ICD-10-CM | POA: Diagnosis not present

## 2017-03-16 ENCOUNTER — Telehealth: Payer: Self-pay

## 2017-03-16 DIAGNOSIS — E6609 Other obesity due to excess calories: Secondary | ICD-10-CM

## 2017-03-16 MED ORDER — PHENTERMINE HCL 15 MG PO CAPS: 15.0000 mg | ORAL_CAPSULE | ORAL | 0 refills | Status: DC

## 2017-03-16 NOTE — Telephone Encounter (Signed)
Single refill given, she needs to come in for further refills.

## 2017-03-16 NOTE — Telephone Encounter (Signed)
Pt left VM asking for refill of phentermine. Please advise.

## 2017-03-30 NOTE — Telephone Encounter (Signed)
The prescription for the phentermine was faxed to [facility] on May 8 th. [facility] cancelled the order due to the medication not being on the patient's plan. I faxed it today to [facility] per patient request. She pays out of pocket for the phentermine.

## 2017-04-06 DIAGNOSIS — M5416 Radiculopathy, lumbar region: Secondary | ICD-10-CM | POA: Diagnosis not present

## 2017-04-06 DIAGNOSIS — M961 Postlaminectomy syndrome, not elsewhere classified: Secondary | ICD-10-CM | POA: Diagnosis not present

## 2017-04-06 DIAGNOSIS — G894 Chronic pain syndrome: Secondary | ICD-10-CM | POA: Diagnosis not present

## 2017-07-02 DIAGNOSIS — D259 Leiomyoma of uterus, unspecified: Secondary | ICD-10-CM | POA: Diagnosis not present

## 2017-07-02 DIAGNOSIS — R102 Pelvic and perineal pain: Secondary | ICD-10-CM | POA: Diagnosis not present

## 2017-07-02 DIAGNOSIS — N83292 Other ovarian cyst, left side: Secondary | ICD-10-CM | POA: Diagnosis not present

## 2017-07-02 DIAGNOSIS — D229 Melanocytic nevi, unspecified: Secondary | ICD-10-CM | POA: Diagnosis not present

## 2017-07-02 DIAGNOSIS — N952 Postmenopausal atrophic vaginitis: Secondary | ICD-10-CM | POA: Diagnosis not present

## 2017-07-02 DIAGNOSIS — R6889 Other general symptoms and signs: Secondary | ICD-10-CM | POA: Diagnosis not present

## 2017-07-02 DIAGNOSIS — N898 Other specified noninflammatory disorders of vagina: Secondary | ICD-10-CM | POA: Diagnosis not present

## 2017-07-05 DIAGNOSIS — D0359 Melanoma in situ of other part of trunk: Secondary | ICD-10-CM | POA: Diagnosis not present

## 2017-07-05 DIAGNOSIS — R238 Other skin changes: Secondary | ICD-10-CM | POA: Diagnosis not present

## 2017-07-05 DIAGNOSIS — D485 Neoplasm of uncertain behavior of skin: Secondary | ICD-10-CM | POA: Diagnosis not present

## 2017-07-07 DIAGNOSIS — N859 Noninflammatory disorder of uterus, unspecified: Secondary | ICD-10-CM | POA: Diagnosis not present

## 2017-07-07 DIAGNOSIS — Z3202 Encounter for pregnancy test, result negative: Secondary | ICD-10-CM | POA: Diagnosis not present

## 2017-07-07 DIAGNOSIS — R938 Abnormal findings on diagnostic imaging of other specified body structures: Secondary | ICD-10-CM | POA: Diagnosis not present

## 2017-07-14 ENCOUNTER — Encounter: Payer: Self-pay | Admitting: Gynecologic Oncology

## 2017-07-14 ENCOUNTER — Telehealth: Payer: Self-pay | Admitting: Gynecologic Oncology

## 2017-07-14 DIAGNOSIS — M5416 Radiculopathy, lumbar region: Secondary | ICD-10-CM | POA: Diagnosis not present

## 2017-07-14 DIAGNOSIS — Z09 Encounter for follow-up examination after completed treatment for conditions other than malignant neoplasm: Secondary | ICD-10-CM | POA: Diagnosis not present

## 2017-07-14 DIAGNOSIS — G894 Chronic pain syndrome: Secondary | ICD-10-CM | POA: Diagnosis not present

## 2017-07-14 NOTE — Telephone Encounter (Signed)
Appt has been scheduled for the pt to see Dr. Alycia Rossetti on 9/12 at 1215pm. Left a vm with the appt date and time. Letter mailed to the pt and faxed to the referring office.

## 2017-07-21 ENCOUNTER — Ambulatory Visit: Payer: BLUE CROSS/BLUE SHIELD | Attending: Gynecologic Oncology | Admitting: Gynecologic Oncology

## 2017-07-21 ENCOUNTER — Ambulatory Visit (HOSPITAL_BASED_OUTPATIENT_CLINIC_OR_DEPARTMENT_OTHER): Payer: BLUE CROSS/BLUE SHIELD

## 2017-07-21 ENCOUNTER — Encounter: Payer: Self-pay | Admitting: Gynecologic Oncology

## 2017-07-21 VITALS — BP 115/67 | HR 64 | Temp 98.0°F | Resp 18 | Ht 66.0 in | Wt 161.5 lb

## 2017-07-21 DIAGNOSIS — Z79899 Other long term (current) drug therapy: Secondary | ICD-10-CM | POA: Insufficient documentation

## 2017-07-21 DIAGNOSIS — C4359 Malignant melanoma of other part of trunk: Secondary | ICD-10-CM | POA: Diagnosis not present

## 2017-07-21 DIAGNOSIS — I10 Essential (primary) hypertension: Secondary | ICD-10-CM | POA: Diagnosis not present

## 2017-07-21 DIAGNOSIS — D259 Leiomyoma of uterus, unspecified: Secondary | ICD-10-CM | POA: Insufficient documentation

## 2017-07-21 DIAGNOSIS — Z9889 Other specified postprocedural states: Secondary | ICD-10-CM | POA: Diagnosis not present

## 2017-07-21 DIAGNOSIS — N83209 Unspecified ovarian cyst, unspecified side: Secondary | ICD-10-CM

## 2017-07-21 DIAGNOSIS — N83201 Unspecified ovarian cyst, right side: Secondary | ICD-10-CM | POA: Insufficient documentation

## 2017-07-21 DIAGNOSIS — M549 Dorsalgia, unspecified: Secondary | ICD-10-CM | POA: Diagnosis not present

## 2017-07-21 DIAGNOSIS — N83202 Unspecified ovarian cyst, left side: Secondary | ICD-10-CM

## 2017-07-21 DIAGNOSIS — R1032 Left lower quadrant pain: Secondary | ICD-10-CM | POA: Diagnosis not present

## 2017-07-21 DIAGNOSIS — N949 Unspecified condition associated with female genital organs and menstrual cycle: Secondary | ICD-10-CM

## 2017-07-21 DIAGNOSIS — C439 Malignant melanoma of skin, unspecified: Secondary | ICD-10-CM | POA: Diagnosis not present

## 2017-07-21 DIAGNOSIS — Z803 Family history of malignant neoplasm of breast: Secondary | ICD-10-CM | POA: Diagnosis not present

## 2017-07-21 DIAGNOSIS — Z888 Allergy status to other drugs, medicaments and biological substances status: Secondary | ICD-10-CM | POA: Insufficient documentation

## 2017-07-21 DIAGNOSIS — E119 Type 2 diabetes mellitus without complications: Secondary | ICD-10-CM

## 2017-07-21 LAB — BASIC METABOLIC PANEL
Anion Gap: 8 mEq/L (ref 3–11)
BUN: 19.4 mg/dL (ref 7.0–26.0)
CO2: 24 mEq/L (ref 22–29)
Calcium: 8.9 mg/dL (ref 8.4–10.4)
Chloride: 106 mEq/L (ref 98–109)
Creatinine: 0.9 mg/dL (ref 0.6–1.1)
EGFR: 70 mL/min/{1.73_m2} — ABNORMAL LOW (ref >90)
Glucose: 102 mg/dl (ref 70–140)
Potassium: 3.6 mEq/L (ref 3.5–5.1)
Sodium: 139 mEq/L (ref 136–145)

## 2017-07-21 NOTE — Patient Instructions (Signed)
Plan to have a CT scan of the abdomen and pelvis.  We will inform you of the results.  Please call for any questions or concerns.

## 2017-07-21 NOTE — Progress Notes (Signed)
Consult Note: Gyn-Onc  Nancy Poole 51 y.o. female  CC:  Chief Complaint  Patient presents with  . Cyst of ovary, unspecified laterality    HPI: Patient is seen today in consultation at the request of Dr. Claria Dice.  Patient is a 51 year old gravida 2 para 2 whose last nostril cycle was in December 2017. She's been complaining of a few months of left lower quadrant pain that she states "it feels like someone is touching my ovary". She states that occasionally becomes "debilitating" a few times a day. We will wake her up at night if she moves. The pain is almost always associated with movement. She is taking approximately 4 hydrocodone pills per day. She has been on chronic narcotics since 2015 which she uses for her back pain. She states that she touches the left side of her pubic symphysis that this will lead to pain. She was seen by her gynecologist as any patient. She were ultrasound on August 24 that revealed the uterus to be 9.2 x 5.2 x 5.6 cm with fibroids. The endometrium was 7.1 mm with 1.7 mm of fluid in the cavity. The right ovary measured 2.9 x 1.2 x 1.5 cm. The left ovary measured 4.4 x 3.3 x 3.4 cm complex cyst measuring 1.7 and another one measuring 1.8. CA-125 was obtained that was 10.4. Her estradiol level was 79.2. She had an endometrial biopsy that revealed disordered proliferative endometrium. She is up-to-date on her Pap smears. She had one 07/30/2016 that was normal with negative high risk HPV.  The same time all this was happening Dr.Kehaya noted a mole on the lower aspect of her right abdominal wall/groin. She was seen by dermatology and had a biopsy performed that showed malignant melanoma. She's having an excision tomorrow. She does not know the exact surgical plan she does not know of any imaging is in order. She is up-to-date on her Pap smears as stated above. Her last mammogram was in the fall of 2017 and she will have another one this fall. Her last colonoscopy was more  than 10 years ago.  Review of Systems: Constitutional: Denies fever. Skin: No diagnosed malignant melanoma scheduled for excisional procedure tomorrow  Cardiovascular: No chest pain, shortness of breath, or edema  Pulmonary: No cough or wheeze.  Gastro Intestinal: Reporting intermittent left lower quadrant pain and discomfort which at times is "debilitating".  No nausea, vomiting, constipation, or diarrhea reported.  Genitourinary: Denies vaginal bleeding Musculoskeletal: + Chronic back pain on chronic narcotics Psychology: She "cannot believe that all of these things are happening to her at the same time"   Current Meds:  Outpatient Encounter Prescriptions as of 52/10/2017  Medication Sig  . amLODipine (NORVASC) 5 MG tablet Take 1 tablet (5 mg total) by mouth daily.  . Calcium Carb-Cholecalciferol (CALCIUM 1000 + D PO) Take by mouth.  . Dapagliflozin-Metformin HCl ER (XIGDUO XR) 08-999 MG TB24 Take 1 tablet by mouth daily.  . ferrous sulfate (FER-IN-SOL) 75 (15 FE) MG/ML SOLN Take 1 mL (15 mg of iron total) by mouth daily.  . fluticasone (FLONASE) 50 MCG/ACT nasal spray Two spray in each nostril twice a day, use left hand for right nostril, and right hand for left nostril.  Marland Kitchen levothyroxine (SYNTHROID, LEVOTHROID) 150 MCG tablet TAKE 1 TABLET BY MOUTH EVERY DAY BEFORE BREAKFAST  . Lorcaserin HCl ER (BELVIQ XR) 20 MG TB24 Take 1 tablet by mouth daily.  . naproxen sodium (ANAPROX) 550 MG tablet   . NONFORMULARY OR COMPOUNDED  ITEM Lancets, strips and 1 glucometer, for testing up to 3 times a day with meals and at HS.  DX= E.11.9  . omeprazole (PRILOSEC) 40 MG capsule Take 1 capsule (40 mg total) by mouth daily.  Marland Kitchen oxyCODONE-acetaminophen (PERCOCET) 7.5-325 MG per tablet Take by mouth.  . topiramate (TOPAMAX) 50 MG tablet One tab by mouth daily.  . traZODone (DESYREL) 50 MG tablet Take 100 mg by mouth at bedtime.   . [DISCONTINUED] diazepam (VALIUM) 5 MG tablet Take 5 mg by mouth 2 (two)  times daily as needed. anxiety  . [DISCONTINUED] liothyronine (CYTOMEL) 5 MCG tablet Take 5 mcg by mouth daily.  . [DISCONTINUED] phentermine 15 MG capsule Take 1 capsule (15 mg total) by mouth every morning.   No facility-administered encounter medications on file as of 52/10/2017.     Allergy:  Allergies  Allergen Reactions  . Mangifera Indica Swelling    Throat swelled  . Pregabalin Swelling and Rash  . Tetracyclines & Related     Social Hx:   Social History   Social History  . Marital status: Married    Spouse name: N/A  . Number of children: N/A  . Years of education: N/A   Occupational History  . Not on file.   Social History Main Topics  . Smoking status: Never Smoker  . Smokeless tobacco: Never Used  . Alcohol use No  . Drug use: No  . Sexual activity: Not on file   Other Topics Concern  . Not on file   Social History Narrative  . No narrative on file    Past Surgical Hx:  Past Surgical History:  Procedure Laterality Date  . THYROIDECTOMY      Past Medical Hx:  Past Medical History:  Diagnosis Date  . Cancer ([place])   . Diabetes mellitus without complication ([place])   . Herniated disc   . Hypertension     Oncology Hx:   No history exists.    Family Hx:  Family History  Problem Relation Age of Onset  . Breast cancer Maternal Aunt     Vitals:  Blood pressure 115/67, pulse 64, temperature 98 F (36.7 C), temperature source Oral, resp. rate 18, height 5\' 6"  (1.676 m), weight 161 lb 8 oz (73.3 kg), SpO2 100 %, peak flow (!) 1 L/min.  Physical Exam: Well-nourished well-developed female in no acute distress.  Neck: Well-healed thyroidectomy incision. No lymphadenopathy no thyromegaly.  Lungs: Clear to auscultation bilaterally.  Cardiac: Regular rate and rhythm.  Abdomen: Well-healed surgical incisions. She is informed that local incision as well as well-healed Pfannenstiel. Abdomen is soft, nontender, nondistended. There are no palpable  masses. There is no hepatospleno megaly. There is no fluid wave. There is no tenderness in the left lower quadrant. In her right lower quadrant she does have a 1 x 0.5 cm area of skin excision. The groins are negative for adenopathy. She does have some tenderness to the palpation in the left groin.  Extremities: No edema.  Pelvic: External genitalia within normal limits. The vagina is well epithelialized. The cervix is visualized is nulliparous. There are no visible lesions. There is no discharge. Bimanual examination the cervix is palpably normal and is nontender. There is no cervical motion tenderness. The uterus is of normal size shape and consistency is small and mobile. She has tenderness upon palpation of the fundus. She also has right tenderness over the right adnexa that there is no masses appreciated. She has no left adnexal tenderness on  exam. Delivered he.  Assessment/Plan: 51 year old with a newly diagnosed malignant melanoma. I have no information regarding this to assess what her stage is. She's having her definitive excision done tomorrow. I cannot really put together her pain with the ultrasound findings. Her pain seems out of proportion to the findings. She does endorse a history of endometriosis for which she underwent a diagnostic laparoscopy in the past. It could be that she also has endometriosis at this time. I will start with a CT scan of the abdomen and pelvis to better delineate her pelvic anatomy. We will also have to coordinate those findings with any pathologic findings from her melanoma. She may need additional radiographic staging for that. Pending the results of her CT scan and what other procedure she may need for her melanoma it may be reasonable to proceed with a diagnostic laparoscopy and removal of the left adnexa if her pain is persistent.  We will call her with results of the CT scan and determine her disposition pending these results.  It was a pleasure to partner in  the care of this very pleasant patient.  Nancy Poole,Nancy A., MD 07/21/2017, 1:07 PM

## 2017-07-22 DIAGNOSIS — L905 Scar conditions and fibrosis of skin: Secondary | ICD-10-CM | POA: Diagnosis not present

## 2017-07-22 DIAGNOSIS — C4359 Malignant melanoma of other part of trunk: Secondary | ICD-10-CM | POA: Diagnosis not present

## 2017-07-27 ENCOUNTER — Ambulatory Visit (HOSPITAL_COMMUNITY)
Admission: RE | Admit: 2017-07-27 | Discharge: 2017-07-27 | Disposition: A | Discharge status: Home / Self Care | Payer: BLUE CROSS/BLUE SHIELD | Source: Ambulatory Visit | Attending: Gynecologic Oncology | Admitting: Gynecologic Oncology

## 2017-07-27 DIAGNOSIS — N7011 Chronic salpingitis: Secondary | ICD-10-CM | POA: Diagnosis not present

## 2017-07-27 DIAGNOSIS — D259 Leiomyoma of uterus, unspecified: Secondary | ICD-10-CM | POA: Insufficient documentation

## 2017-07-27 DIAGNOSIS — N949 Unspecified condition associated with female genital organs and menstrual cycle: Secondary | ICD-10-CM

## 2017-07-27 DIAGNOSIS — K8020 Calculus of gallbladder without cholecystitis without obstruction: Secondary | ICD-10-CM | POA: Insufficient documentation

## 2017-07-27 DIAGNOSIS — N83209 Unspecified ovarian cyst, unspecified side: Secondary | ICD-10-CM

## 2017-07-27 MED ORDER — IOPAMIDOL (ISOVUE-300) INJECTION 61%
100.0000 mL | Freq: Once | INTRAVENOUS | Status: AC | PRN
  Administered 2017-07-27: 100 mL via INTRAVENOUS

## 2017-07-27 MED ORDER — IOPAMIDOL (ISOVUE-300) INJECTION 61%
INTRAVENOUS | Status: AC
  Filled 2017-07-27: qty 100

## 2017-07-28 ENCOUNTER — Telehealth: Payer: Self-pay | Admitting: Sports Medicine

## 2017-07-28 DIAGNOSIS — Z1211 Encounter for screening for malignant neoplasm of colon: Secondary | ICD-10-CM

## 2017-07-28 NOTE — Telephone Encounter (Signed)
Patient needs referral for a Colonoscopy and she would like to go do [facility] in [address] [place]. Thanks

## 2017-07-29 ENCOUNTER — Telehealth: Payer: Self-pay | Admitting: Gynecologic Oncology

## 2017-07-29 NOTE — Telephone Encounter (Signed)
Orders placed.

## 2017-07-29 NOTE — Telephone Encounter (Signed)
Left message for patient with CT scan results.  Advised to call the office for any questions or concerns.

## 2017-08-11 DIAGNOSIS — Z1211 Encounter for screening for malignant neoplasm of colon: Secondary | ICD-10-CM | POA: Diagnosis not present

## 2017-08-11 DIAGNOSIS — D128 Benign neoplasm of rectum: Secondary | ICD-10-CM | POA: Diagnosis not present

## 2017-08-11 LAB — HM COLONOSCOPY

## 2017-09-03 ENCOUNTER — Other Ambulatory Visit: Payer: Self-pay | Admitting: Sports Medicine

## 2017-09-03 DIAGNOSIS — E6609 Other obesity due to excess calories: Secondary | ICD-10-CM

## 2017-09-20 DIAGNOSIS — L814 Other melanin hyperpigmentation: Secondary | ICD-10-CM | POA: Diagnosis not present

## 2017-09-20 DIAGNOSIS — D225 Melanocytic nevi of trunk: Secondary | ICD-10-CM | POA: Diagnosis not present

## 2017-09-20 DIAGNOSIS — L821 Other seborrheic keratosis: Secondary | ICD-10-CM | POA: Diagnosis not present

## 2017-09-20 DIAGNOSIS — D1801 Hemangioma of skin and subcutaneous tissue: Secondary | ICD-10-CM | POA: Diagnosis not present

## 2017-10-13 DIAGNOSIS — G894 Chronic pain syndrome: Secondary | ICD-10-CM | POA: Diagnosis not present

## 2017-10-13 DIAGNOSIS — M5416 Radiculopathy, lumbar region: Secondary | ICD-10-CM | POA: Diagnosis not present

## 2017-10-20 DIAGNOSIS — Z1231 Encounter for screening mammogram for malignant neoplasm of breast: Secondary | ICD-10-CM | POA: Diagnosis not present

## 2017-11-04 DIAGNOSIS — Z23 Encounter for immunization: Secondary | ICD-10-CM | POA: Diagnosis not present

## 2017-12-26 DIAGNOSIS — R05 Cough: Secondary | ICD-10-CM | POA: Diagnosis not present

## 2017-12-26 DIAGNOSIS — J101 Influenza due to other identified influenza virus with other respiratory manifestations: Secondary | ICD-10-CM | POA: Diagnosis not present

## 2017-12-26 DIAGNOSIS — R509 Fever, unspecified: Secondary | ICD-10-CM | POA: Diagnosis not present

## 2018-01-04 ENCOUNTER — Ambulatory Visit (INDEPENDENT_AMBULATORY_CARE_PROVIDER_SITE_OTHER): Payer: BLUE CROSS/BLUE SHIELD

## 2018-01-04 ENCOUNTER — Ambulatory Visit (INDEPENDENT_AMBULATORY_CARE_PROVIDER_SITE_OTHER): Payer: BLUE CROSS/BLUE SHIELD | Admitting: Sports Medicine

## 2018-01-04 ENCOUNTER — Encounter: Payer: Self-pay | Admitting: Sports Medicine

## 2018-01-04 DIAGNOSIS — M7541 Impingement syndrome of right shoulder: Secondary | ICD-10-CM

## 2018-01-04 DIAGNOSIS — M7502 Adhesive capsulitis of left shoulder: Secondary | ICD-10-CM | POA: Insufficient documentation

## 2018-01-04 DIAGNOSIS — E119 Type 2 diabetes mellitus without complications: Secondary | ICD-10-CM | POA: Diagnosis not present

## 2018-01-04 DIAGNOSIS — E6609 Other obesity due to excess calories: Secondary | ICD-10-CM | POA: Diagnosis not present

## 2018-01-04 DIAGNOSIS — M19011 Primary osteoarthritis, right shoulder: Secondary | ICD-10-CM | POA: Diagnosis not present

## 2018-01-04 DIAGNOSIS — M7542 Impingement syndrome of left shoulder: Secondary | ICD-10-CM

## 2018-01-04 DIAGNOSIS — M25511 Pain in right shoulder: Secondary | ICD-10-CM | POA: Diagnosis not present

## 2018-01-04 MED ORDER — TOPIRAMATE 100 MG PO TABS: ORAL_TABLET | ORAL | 0 refills | Status: DC

## 2018-01-04 MED ORDER — GLUCOPHAGE XR 500 MG PO TB24: 500.0000 mg | ORAL_TABLET | Freq: Every day | ORAL | 3 refills | Status: DC

## 2018-01-04 MED ORDER — LORCASERIN HCL ER 20 MG PO TB24: 1.0000 | ORAL_TABLET | Freq: Every day | ORAL | 0 refills | Status: DC

## 2018-01-04 NOTE — Assessment & Plan Note (Signed)
Refilling Belviq.  Increasing Topamax to 100 mg.

## 2018-01-04 NOTE — Progress Notes (Signed)
Subjective:    CC: Couple of issues  HPI: Feeling dizzy: Most recent hemoglobin A1c was 5.1, she continues with XigDuo.  Over the past several weeks she is noted an increasing amount of dizziness when she goes from sitting to standing and particularly in the morning.  Sugars have ranged around 80 during these episodes.  Has not passed out.  Right shoulder pain: Moderate, persistent, localized over the deltoid and worse with overhead activities, does keep her up at night.  Localized without radiation.  No trauma.  Obesity: Initial good weight loss with Belviq, desires a refill.  She has gained about 10 pounds.  Okay going up on topiramate.  I reviewed the past medical history, family history, social history, surgical history, and allergies today and no changes were needed.  Please see the problem list section below in epic for further details.  Past Medical History: Past Medical History:  Diagnosis Date  . Cancer ([place])   . Diabetes mellitus without complication ([place])   . Herniated disc   . Hypertension    Past Surgical History: Past Surgical History:  Procedure Laterality Date  . THYROIDECTOMY     Social History: Social History   Socioeconomic History  . Marital status: Married    Spouse name: None  . Number of children: None  . Years of education: None  . Highest education level: None  Social Needs  . Financial resource strain: None  . Food insecurity - worry: None  . Food insecurity - inability: None  . Transportation needs - medical: None  . Transportation needs - non-medical: None  Occupational History  . None  Tobacco Use  . Smoking status: Never Smoker  . Smokeless tobacco: Never Used  Substance and Sexual Activity  . Alcohol use: No  . Drug use: No  . Sexual activity: None  Other Topics Concern  . None  Social History Narrative  . None   Family History: Family History  Problem Relation Age of Onset  . Breast cancer Maternal Aunt     Allergies: Allergies  Allergen Reactions  . Mangifera Indica Swelling    Throat swelled  . Pregabalin Swelling and Rash  . Tetracyclines & Related    Medications: See med rec.  Review of Systems: No fevers, chills, night sweats, weight loss, chest pain, or shortness of breath.   Objective:    General: Well Developed, well nourished, and in no acute distress.  Neuro: Alert and oriented x3, extra-ocular muscles intact, sensation grossly intact.  HEENT: Normocephalic, atraumatic, pupils equal round reactive to light, neck supple, no masses, no lymphadenopathy, thyroid nonpalpable.  Skin: Warm and dry, no rashes. Cardiac: Regular rate and rhythm, no murmurs rubs or gallops, no lower extremity edema.  Respiratory: Clear to auscultation bilaterally. Not using accessory muscles, speaking in full sentences. Right Shoulder: Inspection reveals no abnormalities, atrophy or asymmetry. Palpation is normal with no tenderness over AC joint or bicipital groove. ROM is full in all planes. Rotator cuff strength normal throughout. Positive Neer and Hawkin's tests, empty can. Speeds and Yergason's tests normal. Minimally positive Obrien's, negative crank, negative clunk, and good stability. Normal scapular function observed. No painful arc and no drop arm sign. No apprehension sign  Impression and Recommendations:    Diabetes mellitus, type 2 Per patient report most recent hemoglobin A1c was 5.1, she is getting some orthostatic symptoms. I do not think she needs her SGLT-2 inhibitor. Switching to lower dose extended release Metformin without [place].    Obesity Refilling Belviq.  Increasing Topamax to 100 mg.  Impingement syndrome of right shoulder Home rehab exercises given with a Thera-Band. X-rays. Return in 2-4 weeks, subacromial injection if no better.  I spent 25 minutes with this patient, greater than 50% was face-to-face time counseling regarding the above  diagnoses ___________________________________________ Gwen Her. Dianah Field, M.D., ABFM., CAQSM. Primary Care and [place] Instructor of [employer] [facility] [employer]

## 2018-01-04 NOTE — Assessment & Plan Note (Signed)
Home rehab exercises given with a Thera-Band. X-rays. Return in 2-4 weeks, subacromial injection if no better.

## 2018-01-04 NOTE — Assessment & Plan Note (Signed)
Per patient report most recent hemoglobin A1c was 5.1, she is getting some orthostatic symptoms. I do not think she needs her SGLT-2 inhibitor. Switching to lower dose extended release Metformin without [place].

## 2018-01-06 ENCOUNTER — Telehealth: Payer: Self-pay

## 2018-01-06 NOTE — Telephone Encounter (Signed)
Patient called in advising that her pharmacy stated Brand Glucophage XR has been discontinued. She stated that the generic is available but at her last ov, provider advised that the diarrhea she was having could have been from the generic medication so she needs to know what is the next step or substitution.  Advised I would forward message to provider for next step.  Per patient changed pharmacy to [facility].

## 2018-01-07 NOTE — Telephone Encounter (Signed)
Per provider, called pharmacy - spoke to Travis/Pharmacist, gave verbal Rx Metformin HCL ER 500 mg 1 tab orally daily with breakfast #30 [id number]

## 2018-01-07 NOTE — Telephone Encounter (Signed)
Generic extended release metformin is ok, give them a verbal to do XR metformin but not necessarily dispensed as written.

## 2018-01-24 ENCOUNTER — Other Ambulatory Visit: Payer: Self-pay | Admitting: Sports Medicine

## 2018-01-24 DIAGNOSIS — E6609 Other obesity due to excess calories: Secondary | ICD-10-CM

## 2018-01-24 NOTE — Telephone Encounter (Signed)
looks like at last visit increased the mg to 100. Needs sig changed if this is correct

## 2018-01-25 ENCOUNTER — Other Ambulatory Visit: Payer: Self-pay | Admitting: Sports Medicine

## 2018-01-27 DIAGNOSIS — R05 Cough: Secondary | ICD-10-CM | POA: Diagnosis not present

## 2018-01-27 DIAGNOSIS — B349 Viral infection, unspecified: Secondary | ICD-10-CM | POA: Diagnosis not present

## 2018-01-31 DIAGNOSIS — G894 Chronic pain syndrome: Secondary | ICD-10-CM | POA: Diagnosis not present

## 2018-01-31 DIAGNOSIS — M5416 Radiculopathy, lumbar region: Secondary | ICD-10-CM | POA: Diagnosis not present

## 2018-01-31 DIAGNOSIS — M7581 Other shoulder lesions, right shoulder: Secondary | ICD-10-CM | POA: Diagnosis not present

## 2018-02-03 DIAGNOSIS — D1801 Hemangioma of skin and subcutaneous tissue: Secondary | ICD-10-CM | POA: Diagnosis not present

## 2018-02-03 DIAGNOSIS — L821 Other seborrheic keratosis: Secondary | ICD-10-CM | POA: Diagnosis not present

## 2018-02-03 DIAGNOSIS — R233 Spontaneous ecchymoses: Secondary | ICD-10-CM | POA: Diagnosis not present

## 2018-02-03 DIAGNOSIS — D2339 Other benign neoplasm of skin of other parts of face: Secondary | ICD-10-CM | POA: Diagnosis not present

## 2018-02-06 ENCOUNTER — Other Ambulatory Visit: Payer: Self-pay | Admitting: Sports Medicine

## 2018-02-06 DIAGNOSIS — I10 Essential (primary) hypertension: Secondary | ICD-10-CM

## 2018-02-17 DIAGNOSIS — M76821 Posterior tibial tendinitis, right leg: Secondary | ICD-10-CM | POA: Diagnosis not present

## 2018-02-17 DIAGNOSIS — M5126 Other intervertebral disc displacement, lumbar region: Secondary | ICD-10-CM | POA: Diagnosis not present

## 2018-02-17 DIAGNOSIS — M5416 Radiculopathy, lumbar region: Secondary | ICD-10-CM | POA: Diagnosis not present

## 2018-02-17 DIAGNOSIS — M9903 Segmental and somatic dysfunction of lumbar region: Secondary | ICD-10-CM | POA: Diagnosis not present

## 2018-02-18 ENCOUNTER — Encounter: Payer: Self-pay | Admitting: Osteopathic Medicine

## 2018-02-18 ENCOUNTER — Ambulatory Visit (INDEPENDENT_AMBULATORY_CARE_PROVIDER_SITE_OTHER): Payer: BLUE CROSS/BLUE SHIELD | Admitting: Osteopathic Medicine

## 2018-02-18 VITALS — BP 125/66 | HR 80 | Temp 97.8°F | Wt 164.1 lb

## 2018-02-18 DIAGNOSIS — E119 Type 2 diabetes mellitus without complications: Secondary | ICD-10-CM | POA: Diagnosis not present

## 2018-02-18 DIAGNOSIS — R300 Dysuria: Secondary | ICD-10-CM | POA: Diagnosis not present

## 2018-02-18 LAB — POCT URINALYSIS DIPSTICK
Bilirubin, UA: NEGATIVE
Blood, UA: NEGATIVE
Glucose, UA: NEGATIVE
Ketones, UA: NEGATIVE
Leukocytes, UA: NEGATIVE
Nitrite, UA: NEGATIVE
Protein, UA: NEGATIVE
Spec Grav, UA: 1.02 (ref 1.010–1.025)
Urobilinogen, UA: 0.2 E.U./dL
pH, UA: 7 (ref 5.0–8.0)

## 2018-02-18 MED ORDER — NITROFURANTOIN MONOHYD MACRO 100 MG PO CAPS: 100.0000 mg | ORAL_CAPSULE | Freq: Two times a day (BID) | ORAL | 0 refills | Status: DC

## 2018-02-18 MED ORDER — FLUCONAZOLE 150 MG PO TABS: 150.0000 mg | ORAL_TABLET | Freq: Once | ORAL | 1 refills | Status: AC

## 2018-02-18 NOTE — Patient Instructions (Addendum)
   Urine test in the office does not show signs of infection, but symptoms are consistent with a UTI so we are going to treat for this.   We are sending the urine to the lab for further testing.  If all testing is normal and your symptoms are still bothering you, please make an appointment with [place] for further evaluation to determine what else may be the cause, or if specialist care is needed

## 2018-02-18 NOTE — Progress Notes (Signed)
HPI: Nancy Poole is a 52 y.o. female who  has a past medical history of Cancer ([place]), Diabetes mellitus without complication ([place]), Herniated disc, and Hypertension.  she presents to [facility] today, 02/18/18,  for chief complaint of:  Urinary issues  Discomfort and frequency with urination for about a week, not really burning except at the end of stream. Odor and possibly cloudy urine. No flank pain, no fever/chills, no nausea/vomiting, no abdominal pain. LMP 2 years ago.     Past medical history, surgical history, social history and family history reviewed.   Current medication list and allergy/intolerance information reviewed.    Current Outpatient Medications on File Prior to Visit  Medication Sig Dispense Refill  . amLODipine (NORVASC) 5 MG tablet TAKE 1 TABLET DAILY 90 tablet 1  . Calcium Carb-Cholecalciferol (CALCIUM 1000 + D PO) Take by mouth.    . ferrous sulfate (FER-IN-SOL) 75 (15 FE) MG/ML SOLN Take 1 mL (15 mg of iron total) by mouth daily. 50 mL 0  . fluticasone (FLONASE) 50 MCG/ACT nasal spray Two spray in each nostril twice a day, use left hand for right nostril, and right hand for left nostril. 48 g 3  . GLUCOPHAGE XR 500 MG 24 hr tablet Take 1 tablet (500 mg total) by mouth daily with breakfast. 30 tablet 3  . HYDROcodone-acetaminophen (NORCO) 10-325 MG tablet Take 1 tablet by mouth as directed.    Marland Kitchen levothyroxine (SYNTHROID, LEVOTHROID) 150 MCG tablet TAKE 1 TABLET BY MOUTH EVERY DAY BEFORE BREAKFAST 15 tablet 0  . Lorcaserin HCl ER (BELVIQ XR) 20 MG TB24 Take 1 tablet by mouth daily. 90 tablet 0  . naproxen sodium (ANAPROX) 550 MG tablet     . NONFORMULARY OR COMPOUNDED ITEM Lancets, strips and 1 glucometer, for testing up to 3 times a day with meals and at HS.  DX= E.11.9 300 each prn  . omeprazole (PRILOSEC) 40 MG capsule Take 1 capsule (40 mg total) by mouth daily. 90 capsule 3  . oxyCODONE-acetaminophen (PERCOCET) 7.5-325 MG  per tablet Take by mouth.    . topiramate (TOPAMAX) 100 MG tablet One tab by mouth daily. 90 tablet 0  . traZODone (DESYREL) 50 MG tablet Take 100 mg by mouth at bedtime.      No current facility-administered medications on file prior to visit.    Allergies  Allergen Reactions  . Mangifera Indica Swelling    Throat swelled  . Pregabalin Swelling and Rash  . Tetracyclines & Related       Review of Systems:  Constitutional: No recent illness  Cardiac: No  chest pain  Respiratory:  No  shortness of breath.  Gastrointestinal: No  abdominal pain, no change on bowel habits  Musculoskeletal: No new myalgia/arthralgia  Skin: No  Rash   Exam:  BP 125/66 (BP Location: Left Arm, Patient Position: Sitting, Cuff Size: Normal)   Pulse 80   Temp 97.8 F (36.6 C) (Oral)   Wt 164 lb 1.9 oz (74.4 kg)   BMI 26.49 kg/m   Constitutional: VS see above. General Appearance: alert, well-developed, well-nourished, NAD  Eyes: Normal lids and conjunctive, non-icteric sclera  Ears, Nose, Mouth, Throat: MMM, Normal external inspection ears/nares/mouth/lips/gums.  Neck: No masses, trachea midline.   Respiratory: Normal respiratory effort. no wheeze, no rhonchi, no rales  Cardiovascular: S1/S2 normal, no murmur, no rub/gallop auscultated. RRR.   Musculoskeletal: Gait normal. Lloyd's sign negative bilaterally   Neurological: Normal balance/coordination. No tremor.  Skin: warm, dry,  intact.   Psychiatric: Normal judgment/insight. Normal mood and affect. Oriented x3.    Results for orders placed or performed in visit on 02/18/18 (from the past 24 hour(s))  POCT Urinalysis Dipstick     Status: None   Collection Time: 02/18/18  8:25 AM  Result Value Ref Range   Color, UA YELLOW    Clarity, UA CLEAR    Glucose, UA NEGATIVE    Bilirubin, UA NEGATIVE    Ketones, UA NEGATIVE    Spec Grav, UA 1.020 1.010 - 1.025   Blood, UA NEGATIVE    pH, UA 7.0 5.0 - 8.0   Protein, UA NEGATIVE     Urobilinogen, UA 0.2 0.2 or 1.0 E.U./dL   Nitrite, UA NEGATIVE    Leukocytes, UA Negative Negative   Appearance     Odor        ASSESSMENT/PLAN:   Dysuria c/w UTI, will start abx based on symptoms and await further testing  She also had some questions about diabetes medications, she is advised to schedule follow-up with PCP Silverio Decamp, MD     Meds ordered this encounter  Medications  . nitrofurantoin, macrocrystal-monohydrate, (MACROBID) 100 MG capsule    Sig: Take 1 capsule (100 mg total) by mouth 2 (two) times daily.    Dispense:  10 capsule    Refill:  0  . fluconazole (DIFLUCAN) 150 MG tablet    Sig: Take 1 tablet (150 mg total) by mouth once for 1 dose. Repeat 72 hours if yeast infection symptoms persist    Dispense:  2 tablet    Refill:  1   Orders Placed This Encounter  Procedures  . Urine Culture  . Urinalysis, microscopic only  . POCT Urinalysis Dipstick     Patient Instructions   Urine test in the office does not show signs of infection, but symptoms are consistent with a UTI so we are going to treat for this.   We are sending the urine to the lab for further testing.  If all testing is normal and your symptoms are still bothering you, please make an appointment with [place] for further evaluation to determine what else may be the cause, or if specialist care is needed      Follow-up plan: Return if symptoms worsen or fail to improve, and as directed by PCP for routine care .  Visit summary with medication list and pertinent instructions was printed for patient to review, alert [place] if any changes needed. All questions at time of visit were answered - patient instructed to contact office with any additional concerns. ER/RTC precautions were reviewed with the patient and understanding verbalized.     Please note: voice recognition software was used to produce this document, and typos may escape review. Please contact Dr. Sheppard Coil for any needed  clarifications.

## 2018-02-19 LAB — URINE CULTURE
MICRO NUMBER:: 90453969
Result:: NO GROWTH
SPECIMEN QUALITY:: ADEQUATE

## 2018-02-19 LAB — URINALYSIS, MICROSCOPIC ONLY
Bacteria, UA: NONE SEEN /HPF
Hyaline Cast: NONE SEEN /LPF
RBC / HPF: NONE SEEN /HPF (ref 0–2)
WBC, UA: NONE SEEN /HPF (ref 0–5)

## 2018-02-21 ENCOUNTER — Other Ambulatory Visit: Payer: Self-pay | Admitting: Sports Medicine

## 2018-02-21 DIAGNOSIS — R339 Retention of urine, unspecified: Secondary | ICD-10-CM

## 2018-02-21 NOTE — Assessment & Plan Note (Signed)
Persistent pyuria with negative cultures, multiple courses of antibiotics, we do need assistance from urology, likely cystoscopy needed.

## 2018-02-22 DIAGNOSIS — M9903 Segmental and somatic dysfunction of lumbar region: Secondary | ICD-10-CM | POA: Diagnosis not present

## 2018-02-22 DIAGNOSIS — M5416 Radiculopathy, lumbar region: Secondary | ICD-10-CM | POA: Diagnosis not present

## 2018-02-22 DIAGNOSIS — M5126 Other intervertebral disc displacement, lumbar region: Secondary | ICD-10-CM | POA: Diagnosis not present

## 2018-02-22 DIAGNOSIS — M76821 Posterior tibial tendinitis, right leg: Secondary | ICD-10-CM | POA: Diagnosis not present

## 2018-02-22 MED ORDER — DAPAGLIFLOZIN PRO-METFORMIN ER 5-500 MG PO TB24: 1.0000 | ORAL_TABLET | Freq: Every day | ORAL | 11 refills | Status: DC

## 2018-02-22 NOTE — Addendum Note (Signed)
Addended by: Silverio Decamp on: 02/22/2018 09:54 AM   Modules accepted: Orders

## 2018-02-22 NOTE — Assessment & Plan Note (Signed)
Per patient request switching back to [facility] in spite of her A1c being as low as 5.1. Of note she did have some orthostatic symptoms and a well-controlled blood sugar so we switched her from XigDuo to plain metformin extended release. She is aware of the risks.

## 2018-03-08 DIAGNOSIS — M76821 Posterior tibial tendinitis, right leg: Secondary | ICD-10-CM | POA: Diagnosis not present

## 2018-03-08 DIAGNOSIS — M9903 Segmental and somatic dysfunction of lumbar region: Secondary | ICD-10-CM | POA: Diagnosis not present

## 2018-03-08 DIAGNOSIS — M5416 Radiculopathy, lumbar region: Secondary | ICD-10-CM | POA: Diagnosis not present

## 2018-03-08 DIAGNOSIS — M5126 Other intervertebral disc displacement, lumbar region: Secondary | ICD-10-CM | POA: Diagnosis not present

## 2018-03-22 ENCOUNTER — Other Ambulatory Visit: Payer: Self-pay | Admitting: Sports Medicine

## 2018-03-22 DIAGNOSIS — M5416 Radiculopathy, lumbar region: Secondary | ICD-10-CM | POA: Diagnosis not present

## 2018-03-22 DIAGNOSIS — M9903 Segmental and somatic dysfunction of lumbar region: Secondary | ICD-10-CM | POA: Diagnosis not present

## 2018-03-22 DIAGNOSIS — M76821 Posterior tibial tendinitis, right leg: Secondary | ICD-10-CM | POA: Diagnosis not present

## 2018-03-22 DIAGNOSIS — M5126 Other intervertebral disc displacement, lumbar region: Secondary | ICD-10-CM | POA: Diagnosis not present

## 2018-03-24 ENCOUNTER — Other Ambulatory Visit: Payer: Self-pay | Admitting: Sports Medicine

## 2018-03-24 DIAGNOSIS — H6983 Other specified disorders of Eustachian tube, bilateral: Secondary | ICD-10-CM

## 2018-04-05 DIAGNOSIS — M5126 Other intervertebral disc displacement, lumbar region: Secondary | ICD-10-CM | POA: Diagnosis not present

## 2018-04-05 DIAGNOSIS — M76821 Posterior tibial tendinitis, right leg: Secondary | ICD-10-CM | POA: Diagnosis not present

## 2018-04-05 DIAGNOSIS — M9903 Segmental and somatic dysfunction of lumbar region: Secondary | ICD-10-CM | POA: Diagnosis not present

## 2018-04-05 DIAGNOSIS — M5416 Radiculopathy, lumbar region: Secondary | ICD-10-CM | POA: Diagnosis not present

## 2018-04-11 ENCOUNTER — Ambulatory Visit (INDEPENDENT_AMBULATORY_CARE_PROVIDER_SITE_OTHER): Payer: BLUE CROSS/BLUE SHIELD

## 2018-04-11 ENCOUNTER — Ambulatory Visit (INDEPENDENT_AMBULATORY_CARE_PROVIDER_SITE_OTHER): Payer: BLUE CROSS/BLUE SHIELD | Admitting: Sports Medicine

## 2018-04-11 DIAGNOSIS — M79671 Pain in right foot: Secondary | ICD-10-CM

## 2018-04-11 DIAGNOSIS — E119 Type 2 diabetes mellitus without complications: Secondary | ICD-10-CM

## 2018-04-11 DIAGNOSIS — E6609 Other obesity due to excess calories: Secondary | ICD-10-CM

## 2018-04-11 DIAGNOSIS — E034 Atrophy of thyroid (acquired): Secondary | ICD-10-CM | POA: Diagnosis not present

## 2018-04-11 DIAGNOSIS — Z0000 Encounter for general adult medical examination without abnormal findings: Secondary | ICD-10-CM

## 2018-04-11 DIAGNOSIS — M25571 Pain in right ankle and joints of right foot: Secondary | ICD-10-CM | POA: Diagnosis not present

## 2018-04-11 DIAGNOSIS — S99911A Unspecified injury of right ankle, initial encounter: Secondary | ICD-10-CM | POA: Diagnosis not present

## 2018-04-11 DIAGNOSIS — S99921A Unspecified injury of right foot, initial encounter: Secondary | ICD-10-CM | POA: Diagnosis not present

## 2018-04-11 MED ORDER — LORCASERIN HCL ER 20 MG PO TB24: 1.0000 | ORAL_TABLET | Freq: Every day | ORAL | 3 refills | Status: DC

## 2018-04-11 MED ORDER — TOPIRAMATE 100 MG PO TABS: ORAL_TABLET | ORAL | 0 refills | Status: DC

## 2018-04-11 NOTE — Assessment & Plan Note (Signed)
Currently in menopause, she does need a bone density test.

## 2018-04-11 NOTE — Assessment & Plan Note (Signed)
Checking routine labs for diabetes.

## 2018-04-11 NOTE — Assessment & Plan Note (Addendum)
Having some increased cold intolerance after recent lowering of levothyroxine dose. Rechecking thyroid function.  TSH is undetectably low, further decreasing levothyroxine to 137 mcg, recheck in 6 weeks.

## 2018-04-11 NOTE — Assessment & Plan Note (Signed)
Occurred after an inversion injury. I do think she broke her foot, x-rays of the foot and ankle, boot.

## 2018-04-11 NOTE — Progress Notes (Addendum)
Subjective:    CC: Multiple issues  HPI: Right foot pain: 2 weeks ago took a misstep, had immediate pain, swelling, bruising with pain over the dorsal lateral midfoot as well as base of the fifth metatarsal.  Pain is persistent, severe.  Obesity: Would like to restart Belviq.  Preventive measures: Due for multiple diabetes screening measures, labs.  I reviewed the past medical history, family history, social history, surgical history, and allergies today and no changes were needed.  Please see the problem list section below in epic for further details.  Past Medical History: Past Medical History:  Diagnosis Date  . Cancer ([place])   . Diabetes mellitus without complication ([place])   . Herniated disc   . Hypertension    Past Surgical History: Past Surgical History:  Procedure Laterality Date  . THYROIDECTOMY     Social History: Social History   Socioeconomic History  . Marital status: Married    Spouse name: Not on file  . Number of children: Not on file  . Years of education: Not on file  . Highest education level: Not on file  Occupational History  . Not on file  Social Needs  . Financial resource strain: Not on file  . Food insecurity:    Worry: Not on file    Inability: Not on file  . Transportation needs:    Medical: Not on file    Non-medical: Not on file  Tobacco Use  . Smoking status: Never Smoker  . Smokeless tobacco: Never Used  Substance and Sexual Activity  . Alcohol use: No  . Drug use: No  . Sexual activity: Not on file  Lifestyle  . Physical activity:    Days per week: Not on file    Minutes per session: Not on file  . Stress: Not on file  Relationships  . Social connections:    Talks on phone: Not on file    Gets together: Not on file    Attends religious service: Not on file    Active member of club or organization: Not on file    Attends meetings of clubs or organizations: Not on file    Relationship status: Not on file  Other Topics  Concern  . Not on file  Social History Narrative  . Not on file   Family History: Family History  Problem Relation Age of Onset  . Breast cancer Maternal Aunt    Allergies: Allergies  Allergen Reactions  . Mangifera Indica Swelling    Throat swelled Throat swelled  . Pregabalin Swelling and Rash  . Tetracyclines & Related    Medications: See med rec.  Review of Systems: No fevers, chills, night sweats, weight loss, chest pain, or shortness of breath.   Objective:    General: Well Developed, well nourished, and in no acute distress.  Neuro: Alert and oriented x3, extra-ocular muscles intact, sensation grossly intact.  HEENT: Normocephalic, atraumatic, pupils equal round reactive to light, neck supple, no masses, no lymphadenopathy, thyroid nonpalpable.  Skin: Warm and dry, no rashes. Cardiac: Regular rate and rhythm, no murmurs rubs or gallops, no lower extremity edema.  Respiratory: Clear to auscultation bilaterally. Not using accessory muscles, speaking in full sentences. Right foot: Still with some visible swelling at the base of the fifth metatarsal. Tenderness at this location as well, as well as over the dorsal lateral midfoot. Range of motion is full in all directions. Strength is 5/5 in all directions. No hallux valgus. No pes cavus or pes planus.  No abnormal callus noted. No pain over the navicular prominence, or base of fifth metatarsal. No tenderness to palpation of the calcaneal insertion of plantar fascia. No pain at the Achilles insertion. No pain over the calcaneal bursa. No pain of the retrocalcaneal bursa. No hallux rigidus or limitus. No tenderness palpation over interphalangeal joints. No pain with compression of the metatarsal heads. Neurovascularly intact distally.  Impression and Recommendations:    Right foot pain Occurred after an inversion injury. I do think she broke her foot, x-rays of the foot and ankle, boot.   Diabetes mellitus,  type 2 Checking routine labs for diabetes.  Hypothyroidism Having some increased cold intolerance after recent lowering of levothyroxine dose. Rechecking thyroid function.  TSH is undetectably low, further decreasing levothyroxine to 137 mcg, recheck in 6 weeks.  Obesity Restarting Belviq.   Continue Topamax.  Annual physical exam Currently in menopause, she does need a bone density test.  I spent 40 minutes with this patient, greater than 50% was face-to-face time counseling regarding the above diagnoses ___________________________________________ Gwen Her. Dianah Field, M.D., ABFM., CAQSM. Primary Care and [place] Instructor of [place] of [facility] of Medicine

## 2018-04-11 NOTE — Assessment & Plan Note (Signed)
Restarting Belviq.   Continue Topamax.

## 2018-04-13 ENCOUNTER — Ambulatory Visit (INDEPENDENT_AMBULATORY_CARE_PROVIDER_SITE_OTHER): Payer: BLUE CROSS/BLUE SHIELD

## 2018-04-13 DIAGNOSIS — Z13820 Encounter for screening for osteoporosis: Secondary | ICD-10-CM

## 2018-04-13 DIAGNOSIS — Z780 Asymptomatic menopausal state: Secondary | ICD-10-CM | POA: Diagnosis not present

## 2018-04-27 DIAGNOSIS — M7581 Other shoulder lesions, right shoulder: Secondary | ICD-10-CM | POA: Diagnosis not present

## 2018-04-27 DIAGNOSIS — M5416 Radiculopathy, lumbar region: Secondary | ICD-10-CM | POA: Diagnosis not present

## 2018-04-27 DIAGNOSIS — G894 Chronic pain syndrome: Secondary | ICD-10-CM | POA: Diagnosis not present

## 2018-04-28 DIAGNOSIS — E034 Atrophy of thyroid (acquired): Secondary | ICD-10-CM | POA: Diagnosis not present

## 2018-04-28 DIAGNOSIS — E119 Type 2 diabetes mellitus without complications: Secondary | ICD-10-CM | POA: Diagnosis not present

## 2018-04-28 DIAGNOSIS — Z0000 Encounter for general adult medical examination without abnormal findings: Secondary | ICD-10-CM | POA: Diagnosis not present

## 2018-04-29 LAB — HIV ANTIBODY (ROUTINE TESTING W REFLEX): HIV 1&2 Ab, 4th Generation: NONREACTIVE

## 2018-04-29 LAB — LIPID PANEL W/REFLEX DIRECT LDL
Cholesterol: 180 mg/dL (ref <200)
HDL: 59 mg/dL (ref >50)
LDL Cholesterol (Calc): 101 mg/dL (calc) — ABNORMAL HIGH
Non-HDL Cholesterol (Calc): 121 mg/dL (calc) (ref <130)
Total CHOL/HDL Ratio: 3.1 (calc) (ref <5.0)
Triglycerides: 102 mg/dL (ref <150)

## 2018-04-29 LAB — CBC
HCT: 35.3 % (ref 35.0–45.0)
Hemoglobin: 11.1 g/dL — ABNORMAL LOW (ref 11.7–15.5)
MCH: 25.4 pg — ABNORMAL LOW (ref 27.0–33.0)
MCHC: 31.4 g/dL — ABNORMAL LOW (ref 32.0–36.0)
MCV: 80.8 fL (ref 80.0–100.0)
MPV: 10.5 fL (ref 7.5–12.5)
Platelets: 295 10*3/uL (ref 140–400)
RBC: 4.37 10*6/uL (ref 3.80–5.10)
RDW: 13.3 % (ref 11.0–15.0)
WBC: 6 10*3/uL (ref 3.8–10.8)

## 2018-04-29 LAB — COMPREHENSIVE METABOLIC PANEL
AG Ratio: 1.7 (calc) (ref 1.0–2.5)
ALT: 11 U/L (ref 6–29)
AST: 16 U/L (ref 10–35)
Albumin: 4 g/dL (ref 3.6–5.1)
Alkaline phosphatase (APISO): 116 U/L (ref 33–130)
BUN: 17 mg/dL (ref 7–25)
CO2: 27 mmol/L (ref 20–32)
Calcium: 8.9 mg/dL (ref 8.6–10.4)
Chloride: 107 mmol/L (ref 98–110)
Creat: 1 mg/dL (ref 0.50–1.05)
Globulin: 2.4 g/dL (calc) (ref 1.9–3.7)
Glucose, Bld: 98 mg/dL (ref 65–99)
Potassium: 3.9 mmol/L (ref 3.5–5.3)
Sodium: 142 mmol/L (ref 135–146)
Total Bilirubin: 0.4 mg/dL (ref 0.2–1.2)
Total Protein: 6.4 g/dL (ref 6.1–8.1)

## 2018-04-29 LAB — TSH: TSH: 0.06 mIU/L — ABNORMAL LOW

## 2018-04-29 LAB — HEMOGLOBIN A1C
Hgb A1c MFr Bld: 6 % of total Hgb — ABNORMAL HIGH (ref <5.7)
Mean Plasma Glucose: 126 (calc)
eAG (mmol/L): 7 (calc)

## 2018-04-29 LAB — T4, FREE: Free T4: 1.5 ng/dL (ref 0.8–1.8)

## 2018-04-29 LAB — MICROALBUMIN / CREATININE URINE RATIO
Creatinine, Urine: 183 mg/dL (ref 20–275)
Microalb Creat Ratio: 9 mcg/mg creat (ref <30)
Microalb, Ur: 1.6 mg/dL

## 2018-04-29 LAB — T3, FREE: T3, Free: 2.9 pg/mL (ref 2.3–4.2)

## 2018-04-29 MED ORDER — LEVOTHYROXINE SODIUM 137 MCG PO TABS: ORAL_TABLET | ORAL | 3 refills | Status: DC

## 2018-04-29 NOTE — Addendum Note (Signed)
Addended by: Silverio Decamp on: 04/29/2018 10:46 AM   Modules accepted: Orders

## 2018-05-03 ENCOUNTER — Ambulatory Visit (INDEPENDENT_AMBULATORY_CARE_PROVIDER_SITE_OTHER): Payer: BLUE CROSS/BLUE SHIELD | Admitting: Sports Medicine

## 2018-05-03 ENCOUNTER — Encounter: Payer: Self-pay | Admitting: Sports Medicine

## 2018-05-03 DIAGNOSIS — E6609 Other obesity due to excess calories: Secondary | ICD-10-CM | POA: Diagnosis not present

## 2018-05-03 DIAGNOSIS — M722 Plantar fascial fibromatosis: Secondary | ICD-10-CM

## 2018-05-03 DIAGNOSIS — M79671 Pain in right foot: Secondary | ICD-10-CM | POA: Diagnosis not present

## 2018-05-03 DIAGNOSIS — E034 Atrophy of thyroid (acquired): Secondary | ICD-10-CM | POA: Diagnosis not present

## 2018-05-03 MED ORDER — PHENTERMINE HCL 37.5 MG PO TABS: ORAL_TABLET | ORAL | 0 refills | Status: DC

## 2018-05-03 NOTE — Assessment & Plan Note (Signed)
Return for a new set of custom molded orthotics.

## 2018-05-03 NOTE — Assessment & Plan Note (Signed)
Persistent weight gain on multiple weight loss medications, taking in too many calories. Starting phentermine, referral to nutrition.

## 2018-05-03 NOTE — Assessment & Plan Note (Signed)
Pain at the base of the fifth metatarsal, dorsal midfoot. At this point her pain has been persistent, not better with boot immobilization, adding an MRI of the right foot. Continue boot for another 2 weeks. Return for custom orthotics.

## 2018-05-03 NOTE — Progress Notes (Signed)
Subjective:    CC: Go over some lab results  HPI: Hypothyroidism: Recent TSH was low, our records indicated levothyroxine 150 mcg, we decreased her to 137 mcg but at the visit today she tells [place] that she has already been decreased to 137 before her labs were done.  She is agreeable to discontinue comanagement and have endocrinology further manage her thyroid disease.  Abnormal weight gain: Currently on Belviq, Topamax, would like to try phentermine again.  Agreeable to touch base with nutrition, she finds it difficult to except that her calorie intake may still be too high.  Right foot pain: Midfoot sprain, pain over the base of the fifth metatarsal, dorsal lateral midfoot, has been in a boot for 2 weeks with persistent pain, overall symptoms 6 weeks.  X-rays negative.  I reviewed the past medical history, family history, social history, surgical history, and allergies today and no changes were needed.  Please see the problem list section below in epic for further details.  Past Medical History: Past Medical History:  Diagnosis Date  . Cancer ([place])   . Diabetes mellitus without complication ([place])   . Herniated disc   . Hypertension    Past Surgical History: Past Surgical History:  Procedure Laterality Date  . THYROIDECTOMY     Social History: Social History   Socioeconomic History  . Marital status: Married    Spouse name: Not on file  . Number of children: Not on file  . Years of education: Not on file  . Highest education level: Not on file  Occupational History  . Not on file  Social Needs  . Financial resource strain: Not on file  . Food insecurity:    Worry: Not on file    Inability: Not on file  . Transportation needs:    Medical: Not on file    Non-medical: Not on file  Tobacco Use  . Smoking status: Never Smoker  . Smokeless tobacco: Never Used  Substance and Sexual Activity  . Alcohol use: No  . Drug use: No  . Sexual activity: Not on file  Lifestyle  .  Physical activity:    Days per week: Not on file    Minutes per session: Not on file  . Stress: Not on file  Relationships  . Social connections:    Talks on phone: Not on file    Gets together: Not on file    Attends religious service: Not on file    Active member of club or organization: Not on file    Attends meetings of clubs or organizations: Not on file    Relationship status: Not on file  Other Topics Concern  . Not on file  Social History Narrative  . Not on file   Family History: Family History  Problem Relation Age of Onset  . Breast cancer Maternal Aunt    Allergies: Allergies  Allergen Reactions  . Mangifera Indica Swelling    Throat swelled Throat swelled  . Pregabalin Swelling and Rash  . Tetracyclines & Related    Medications: See med rec.  Review of Systems: No fevers, chills, night sweats, weight loss, chest pain, or shortness of breath.   Objective:    General: Well Developed, well nourished, and in no acute distress.  Neuro: Alert and oriented x3, extra-ocular muscles intact, sensation grossly intact.  HEENT: Normocephalic, atraumatic, pupils equal round reactive to light, neck supple, no masses, no lymphadenopathy, thyroid nonpalpable.  Skin: Warm and dry, no rashes. Cardiac: Regular rate  and rhythm, no murmurs rubs or gallops, no lower extremity edema.  Respiratory: Clear to auscultation bilaterally. Not using accessory muscles, speaking in full sentences. Right foot: Persistent pain over the dorsal lateral midfoot and the base of the fifth metatarsal, mild visible swelling. Range of motion is full in all directions. Strength is 5/5 in all directions. No hallux valgus. No pes cavus or pes planus. No abnormal callus noted. No tenderness to palpation of the calcaneal insertion of plantar fascia. No pain at the Achilles insertion. No pain over the calcaneal bursa. No pain of the retrocalcaneal bursa. No tenderness to palpation over the tarsals,  metatarsals, or phalanges. No hallux rigidus or limitus. No tenderness palpation over interphalangeal joints. No pain with compression of the metatarsal heads. Neurovascularly intact distally.  Impression and Recommendations:    Bilateral plantar fasciitis Return for a new set of custom molded orthotics.  Obesity Persistent weight gain on multiple weight loss medications, taking in too many calories. Starting phentermine, referral to nutrition.  Hypothyroidism Has been being co-managed by endocrinology and myself. Was very low, she tells me she has already been taking 137 mcg which is what we dropped her to, from now on I will not manage her thyroid function. We will leave it to endocrinology.  Right foot pain Pain at the base of the fifth metatarsal, dorsal midfoot. At this point her pain has been persistent, not better with boot immobilization, adding an MRI of the right foot. Continue boot for another 2 weeks. Return for custom orthotics.  ___________________________________________ Gwen Her. Dianah Field, M.D., ABFM., CAQSM. Primary Care and [place] Instructor of [place] of [facility] of Medicine

## 2018-05-03 NOTE — Assessment & Plan Note (Signed)
Has been being co-managed by endocrinology and myself. Was very low, she tells me she has already been taking 137 mcg which is what we dropped her to, from now on I will not manage her thyroid function. We will leave it to endocrinology.

## 2018-05-16 ENCOUNTER — Ambulatory Visit (INDEPENDENT_AMBULATORY_CARE_PROVIDER_SITE_OTHER): Payer: BLUE CROSS/BLUE SHIELD

## 2018-05-16 ENCOUNTER — Telehealth: Payer: Self-pay | Admitting: Sports Medicine

## 2018-05-16 DIAGNOSIS — M13871 Other specified arthritis, right ankle and foot: Secondary | ICD-10-CM | POA: Diagnosis not present

## 2018-05-16 DIAGNOSIS — M19071 Primary osteoarthritis, right ankle and foot: Secondary | ICD-10-CM | POA: Diagnosis not present

## 2018-05-16 NOTE — Telephone Encounter (Signed)
Received fax from [employer] that Belviq requires a PA. Information has been sent to the insurance company. Awaiting determination.   

## 2018-05-17 DIAGNOSIS — E669 Obesity, unspecified: Secondary | ICD-10-CM | POA: Diagnosis not present

## 2018-05-17 NOTE — Telephone Encounter (Signed)
Per covermymeds Belviq is excluded from the plan and does not give me the option to appeal. Its excluded from the benefits at this time. Please advise.

## 2018-05-17 NOTE — Telephone Encounter (Signed)
Please call and let her know she will just need to pay cash since its an exclusion from her insurance plan.

## 2018-05-17 NOTE — Telephone Encounter (Signed)
Patient advised of Dr. Darene Lamer recommendations and voices understanding. Per PCP patient will wear supportive shoes.

## 2018-05-26 ENCOUNTER — Other Ambulatory Visit: Payer: Self-pay

## 2018-05-26 MED ORDER — OMEPRAZOLE 40 MG PO CPDR: 40.0000 mg | DELAYED_RELEASE_CAPSULE | Freq: Every day | ORAL | 3 refills | Status: DC

## 2018-06-23 ENCOUNTER — Encounter: Payer: Self-pay | Admitting: Sports Medicine

## 2018-06-23 ENCOUNTER — Ambulatory Visit (INDEPENDENT_AMBULATORY_CARE_PROVIDER_SITE_OTHER): Payer: BLUE CROSS/BLUE SHIELD | Admitting: Sports Medicine

## 2018-06-23 DIAGNOSIS — M7541 Impingement syndrome of right shoulder: Secondary | ICD-10-CM | POA: Diagnosis not present

## 2018-06-23 DIAGNOSIS — E6609 Other obesity due to excess calories: Secondary | ICD-10-CM

## 2018-06-23 DIAGNOSIS — M722 Plantar fascial fibromatosis: Secondary | ICD-10-CM | POA: Diagnosis not present

## 2018-06-23 MED ORDER — PHENTERMINE HCL 37.5 MG PO TABS: ORAL_TABLET | ORAL | 0 refills | Status: DC

## 2018-06-23 NOTE — Assessment & Plan Note (Signed)
We have treated her with rehab exercises, no injections have been done. She did have a few adjustments by a local chiropractor that seemed to work well, she would like to see Dr. Sheppard Coil for consideration of osteopathic manipulation.

## 2018-06-23 NOTE — Assessment & Plan Note (Signed)
Has started seeing a nutritionist, we now have an additional 8 pound weight loss, entering the second month of phentermine. Continue Topamax and Belviq. Return in 1 month. Goal is 150 pounds by the time her vacation arrives.

## 2018-06-23 NOTE — Addendum Note (Signed)
Addended by: Silverio Decamp on: 06/23/2018 10:48 AM   Modules accepted: Orders

## 2018-06-23 NOTE — Progress Notes (Addendum)
    Patient was fitted for a : standard, cushioned, semi-rigid orthotic. The orthotic was heated and afterward the patient stood on the orthotic blank positioned on the orthotic stand. The patient was positioned in subtalar neutral position and 10 degrees of ankle dorsiflexion in a weight bearing stance. After completion of molding, a stable base was applied to the orthotic blank. The blank was ground to a stable position for weight bearing. Size: 8 Base: [place] [employer] and safety inspector and [place]adding: None The patient ambulated these, and they were very comfortable.  I spent 40 minutes with this patient, greater than 50% was face-to-face time counseling regarding the below diagnosis.  Bilateral plantar fasciitis Custom orthotics as well. Continue rehab exercises. Return to see me before the trip for an injection if no better.  Obesity Has started seeing a nutritionist, we now have an additional 8 pound weight loss, entering the second month of phentermine. Continue Topamax and Belviq. Return in 1 month. Goal is 150 pounds by the time her vacation arrives.  Impingement syndrome of right shoulder We have treated her with rehab exercises, no injections have been done. She did have a few adjustments by a local chiropractor that seemed to work well, she would like to see Dr. Sheppard Coil for consideration of osteopathic manipulation.    ___________________________________________ Gwen Her. Dianah Field, M.D., ABFM., CAQSM. Primary Care and [place] Instructor of [place] of [facility] of Medicine

## 2018-06-23 NOTE — Assessment & Plan Note (Signed)
Custom orthotics as well. Continue rehab exercises. Return to see me before the trip for an injection if no better.

## 2018-07-01 ENCOUNTER — Encounter: Payer: Self-pay | Admitting: Osteopathic Medicine

## 2018-07-01 ENCOUNTER — Ambulatory Visit (INDEPENDENT_AMBULATORY_CARE_PROVIDER_SITE_OTHER): Payer: BLUE CROSS/BLUE SHIELD | Admitting: Osteopathic Medicine

## 2018-07-01 VITALS — BP 114/65 | HR 72 | Temp 98.1°F | Wt 163.1 lb

## 2018-07-01 DIAGNOSIS — M9901 Segmental and somatic dysfunction of cervical region: Secondary | ICD-10-CM

## 2018-07-01 DIAGNOSIS — M722 Plantar fascial fibromatosis: Secondary | ICD-10-CM

## 2018-07-01 DIAGNOSIS — M9902 Segmental and somatic dysfunction of thoracic region: Secondary | ICD-10-CM

## 2018-07-01 DIAGNOSIS — M7631 Iliotibial band syndrome, right leg: Secondary | ICD-10-CM

## 2018-07-01 DIAGNOSIS — M25611 Stiffness of right shoulder, not elsewhere classified: Secondary | ICD-10-CM

## 2018-07-01 DIAGNOSIS — M7918 Myalgia, other site: Secondary | ICD-10-CM

## 2018-07-01 NOTE — Progress Notes (Signed)
HPI: Nancy Poole is a 52 y.o. female who  has a past medical history of Cancer ([place]), Diabetes mellitus without complication ([place]), Herniated disc, and Hypertension.  she presents to [facility] today, 07/01/18,  for chief complaint of: OMT referral  Consult for Dr T for shoulder pain, hx impingement syndrome R shoulder.    Shoulder  . Context: previous tx by chiropractor.  hx impingement syndrome. No injections thus far.  . Location: R shoulder . Quality: sore, limited ROM  . Modifying factors: chiropractor helped . Assoc signs/symptoms: no numbness/tingling  Lower back/Hip . Location: R hip and down leg . Quality: sore, tightness . Context: no injury  Foot concern  . Location: top of R foot . Quality: sore, also feels like there isn't as much muscle/skinas there should be  . Duration: months . Context: w/u by Dr T already, trial walking boot, considering injections. Has had orthotics       Past medical, surgical, social and family history reviewed:  Patient Active Problem List   Diagnosis Date Noted  . Right foot pain 04/11/2018  . Impingement syndrome of right shoulder 01/04/2018  . Ovarian cyst 07/21/2017  . Eustachian tube dysfunction 04/28/2016  . Incomplete bladder emptying 07/31/2014  . Hypothyroidism 07/24/2014  . Diabetes mellitus, type 2 ([place]) 03/07/2014  . Obesity 01/03/2014  . Hypertension 12/20/2013  . Insomnia 12/20/2013  . Right lumbar radiculitis 11/13/2013  . GERD (gastroesophageal reflux disease) 10/23/2013  . Annual physical exam 10/23/2013  . Bilateral plantar fasciitis 10/03/2013  . HLD (hyperlipidemia) 09/11/2013  . Lung nodule, solitary 08/24/2012  . Otoporosis 08/12/2010  . Primary papillary carcinoma of thyroid, follicular subtype, post thyroidectomy 03/17/2010    Past Surgical History:  Procedure Laterality Date  . THYROIDECTOMY      Social History   Tobacco Use  . Smoking status: Never  Smoker  . Smokeless tobacco: Never Used  Substance Use Topics  . Alcohol use: No    Family History  Problem Relation Age of Onset  . Breast cancer Maternal Aunt      Current medication list and allergy/intolerance information reviewed:    Current Outpatient Medications  Medication Sig Dispense Refill  . ACCU-CHEK GUIDE test strip USE TO TEST UP TO THREE TIMES DAILY WITH MEALS AND AT BEDTIME 300 each 0  . amLODipine (NORVASC) 5 MG tablet TAKE 1 TABLET DAILY 90 tablet 1  . Calcium Carb-Cholecalciferol (CALCIUM 1000 + D PO) Take by mouth.    . Dapagliflozin-metFORMIN HCl ER (XIGDUO XR) 5-500 MG TB24 Take 1 tablet by mouth daily. 30 tablet 11  . ferrous sulfate (FER-IN-SOL) 75 (15 FE) MG/ML SOLN Take 1 mL (15 mg of iron total) by mouth daily. 50 mL 0  . fluticasone (FLONASE) 50 MCG/ACT nasal spray Two spray in each nostril twice a day, use left hand for right nostril, and right hand for left nostril. 48 g 3  . HYDROcodone-acetaminophen (NORCO) 10-325 MG tablet Take 1 tablet by mouth as directed.    Marland Kitchen levothyroxine (SYNTHROID, LEVOTHROID) 137 MCG tablet TAKE 1 TABLET BY MOUTH EVERY DAY BEFORE BREAKFAST 30 tablet 3  . Lorcaserin HCl ER (BELVIQ XR) 20 MG TB24 Take 1 tablet by mouth daily. 30 tablet 3  . naproxen sodium (ANAPROX) 550 MG tablet     . nitrofurantoin, macrocrystal-monohydrate, (MACROBID) 100 MG capsule Take 1 capsule (100 mg total) by mouth 2 (two) times daily. 10 capsule 0  . NONFORMULARY OR COMPOUNDED ITEM Lancets, strips and 1  glucometer, for testing up to 3 times a day with meals and at HS.  DX= E.11.9 300 each prn  . omeprazole (PRILOSEC) 40 MG capsule Take 1 capsule (40 mg total) by mouth daily. 90 capsule 3  . oxyCODONE-acetaminophen (PERCOCET) 7.5-325 MG per tablet Take by mouth.    . phentermine (ADIPEX-P) 37.5 MG tablet One tab by mouth qAM 30 tablet 0  . topiramate (TOPAMAX) 100 MG tablet One tab by mouth daily. 90 tablet 0  . traZODone (DESYREL) 50 MG tablet Take  100 mg by mouth at bedtime.      No current facility-administered medications for this visit.     Allergies  Allergen Reactions  . Mangifera Indica Swelling    Throat swelled Throat swelled  . Pregabalin Swelling and Rash  . Tetracyclines & Related       Review of Systems:  Constitutional:  No  fever, no chills, No recent illness  HEENT: No  headache, no vision change  Cardiac: No  chest pain  Respiratory:  No  shortness of breath. No  Cough  Gastrointestinal: No  abdominal pain, No  nausea  Musculoskeletal: See HPI  Skin: No  Rash, No other wounds/concerning lesions  Genitourinary: No  incontinence, No  abnormal genital bleeding, No abnormal genital discharge  Hem/Onc: No  easy bruising/bleeding, No  abnormal lymph node  Neurologic: No  weakness, No  dizziness  Psychiatric: No  concerns with depression, No  concerns with anxiety  Exam:  BP 114/65 (BP Location: Left Arm, Patient Position: Sitting, Cuff Size: Normal)   Pulse 72   Temp 98.1 F (36.7 C) (Oral)   Wt 163 lb 1.6 oz (74 kg)   BMI 26.33 kg/m   Constitutional: VS see above. General Appearance: alert, well-developed, well-nourished, NAD  Eyes: Normal lids and conjunctive, non-icteric sclera  Ears, Nose, Mouth, Throat: MMM, Normal external inspection ears/nares/mouth/lips/gums.  Neck: No masses, trachea midline.   Respiratory: Normal respiratory effort.   Gastrointestinal: Nontender, no masses. No hepatomegaly, no splenomegaly. No hernia appreciated. Bowel sounds normal. Rectal exam deferred.   Musculoskeletal: Gait normal. No clubbing/cyanosis of digits.   Normal ROM c-spine passive/active. (+)TTP around C6/C7 paraspinals in neurtal position  Normal passive ROM shoulder, active ROM limited, difficulty w/ abduction and external rotation. No tender point appreciated.   Rhomboids on R (+)TTP at scapular edge and thoracic paraspinals   ITB on R tenderness, no trochanteric tenderness  Ankle on R  normal ROM, no crepitus or effusion   Neurological: Normal balance/coordination. No tremor. No cranial nerve deficit on limited exam. Motor and sensation intact and symmetric. Cerebellar reflexes intact.   Skin: warm, dry, intact. No rash/ulcer.  Psychiatric: Normal judgment/insight. Normal mood and affect. Oriented x3.    No results found for this or any previous visit (from the past 72 hour(s)).  No results found.   ASSESSMENT/PLAN:   Decreased right shoulder range of motion - spencer's applied, some rlieef w/ gentle ROM exercises  Rhomboid muscle pain - MFR and FPR applied to (+)relief   Somatic dysfunction of spine, cervical - MFR to (+)relief  Somatic dysfunction of spine, thoracic - MFR to (+)relief  Iliotibial band syndrome of right side - MFR and FPR to (+)relief   Bilateral plantar fasciitis - demonstrated stretching, MFR      Visit summary with medication list and pertinent instructions was printed for patient to review. All questions at time of visit were answered - patient instructed to contact office with any additional concerns. ER/RTC  precautions were reviewed with the patient.   Follow-up plan: Return for repeat treatments as needed.  Note: Total time spent 40 minutes, greater than 50% of the visit was spent face-to-face counseling and coordinating care for the following: The primary encounter diagnosis was Decreased right shoulder range of motion. Diagnoses of Rhomboid muscle pain, Somatic dysfunction of spine, cervical, Somatic dysfunction of spine, thoracic, Iliotibial band syndrome of right side, and Bilateral plantar fasciitis were also pertinent to this visit.Marland Kitchen  Please note: voice recognition software was used to produce this document, and typos may escape review. Please contact Dr. Sheppard Coil for any needed clarifications.

## 2018-07-07 DIAGNOSIS — D225 Melanocytic nevi of trunk: Secondary | ICD-10-CM | POA: Diagnosis not present

## 2018-07-07 DIAGNOSIS — Z85820 Personal history of malignant melanoma of skin: Secondary | ICD-10-CM | POA: Diagnosis not present

## 2018-07-07 DIAGNOSIS — L821 Other seborrheic keratosis: Secondary | ICD-10-CM | POA: Diagnosis not present

## 2018-07-07 DIAGNOSIS — D1801 Hemangioma of skin and subcutaneous tissue: Secondary | ICD-10-CM | POA: Diagnosis not present

## 2018-07-20 ENCOUNTER — Telehealth: Payer: Self-pay | Admitting: Sports Medicine

## 2018-07-21 NOTE — Telephone Encounter (Signed)
That is too bad.  I asked her to make an appointment 1 month after the previous one.  She can try to move her appointment up a little bit.

## 2018-07-25 ENCOUNTER — Ambulatory Visit (INDEPENDENT_AMBULATORY_CARE_PROVIDER_SITE_OTHER): Payer: BLUE CROSS/BLUE SHIELD | Admitting: Sports Medicine

## 2018-07-25 ENCOUNTER — Encounter: Payer: Self-pay | Admitting: Sports Medicine

## 2018-07-25 DIAGNOSIS — E6609 Other obesity due to excess calories: Secondary | ICD-10-CM

## 2018-07-25 DIAGNOSIS — M5416 Radiculopathy, lumbar region: Secondary | ICD-10-CM | POA: Diagnosis not present

## 2018-07-25 DIAGNOSIS — M7541 Impingement syndrome of right shoulder: Secondary | ICD-10-CM | POA: Diagnosis not present

## 2018-07-25 DIAGNOSIS — Z7189 Other specified counseling: Secondary | ICD-10-CM | POA: Diagnosis not present

## 2018-07-25 DIAGNOSIS — Z7184 Encounter for health counseling related to travel: Secondary | ICD-10-CM | POA: Insufficient documentation

## 2018-07-25 DIAGNOSIS — Z23 Encounter for immunization: Secondary | ICD-10-CM | POA: Diagnosis not present

## 2018-07-25 DIAGNOSIS — M7581 Other shoulder lesions, right shoulder: Secondary | ICD-10-CM | POA: Diagnosis not present

## 2018-07-25 MED ORDER — TOPIRAMATE 100 MG PO TABS: 100.0000 mg | ORAL_TABLET | Freq: Two times a day (BID) | ORAL | 3 refills | Status: DC

## 2018-07-25 MED ORDER — SCOPOLAMINE 1 MG/3DAYS TD PT72: 1.0000 | MEDICATED_PATCH | TRANSDERMAL | 3 refills | Status: DC

## 2018-07-25 MED ORDER — PHENTERMINE HCL 37.5 MG PO TABS: ORAL_TABLET | ORAL | 0 refills | Status: DC

## 2018-07-25 MED ORDER — DIAZEPAM 5 MG PO TABS: 5.0000 mg | ORAL_TABLET | Freq: Three times a day (TID) | ORAL | 0 refills | Status: DC | PRN

## 2018-07-25 NOTE — Assessment & Plan Note (Signed)
Good weight loss after the initial month, continues with nutrition. Plateaued over this past month, entering the third month, increase Topamax to 100 twice a day, refilling phentermine, she has discontinued Belviq. Goal is 150 pounds by the time her vacation arrives.

## 2018-07-25 NOTE — Assessment & Plan Note (Signed)
Traveling to some first world countries in [place], adding hep a and B titers before considering Twinrix. Adding scopolamine patches, as well as a couple of Valium to knock her out on the plane.

## 2018-07-25 NOTE — Assessment & Plan Note (Signed)
Subacromial injection as above, continue rehab exercises, return in 1 month for this.

## 2018-07-25 NOTE — Progress Notes (Signed)
Subjective:    CC: Several issues  HPI: Obesity: Gained some weight.  Travel: Doing a tour of [place], needs to make sure she is immune to hep a and B, needs some medicine to help her sleep on the plane, and some motion sickness patches.  Shoulder pain: Right-sided, over the deltoid, worse with overhead activities.  Moderate, persistent without radiation.  Has done the rehab exercises for over a month without any improvement.  I reviewed the past medical history, family history, social history, surgical history, and allergies today and no changes were needed.  Please see the problem list section below in epic for further details.  Past Medical History: Past Medical History:  Diagnosis Date  . Cancer ([place])   . Diabetes mellitus without complication ([place])   . Herniated disc   . Hypertension    Past Surgical History: Past Surgical History:  Procedure Laterality Date  . THYROIDECTOMY     Social History: Social History   Socioeconomic History  . Marital status: Married    Spouse name: Not on file  . Number of children: Not on file  . Years of education: Not on file  . Highest education level: Not on file  Occupational History  . Not on file  Social Needs  . Financial resource strain: Not on file  . Food insecurity:    Worry: Not on file    Inability: Not on file  . Transportation needs:    Medical: Not on file    Non-medical: Not on file  Tobacco Use  . Smoking status: Never Smoker  . Smokeless tobacco: Never Used  Substance and Sexual Activity  . Alcohol use: No  . Drug use: No  . Sexual activity: Not on file  Lifestyle  . Physical activity:    Days per week: Not on file    Minutes per session: Not on file  . Stress: Not on file  Relationships  . Social connections:    Talks on phone: Not on file    Gets together: Not on file    Attends religious service: Not on file    Active member of club or organization: Not on file    Attends meetings of clubs or  organizations: Not on file    Relationship status: Not on file  Other Topics Concern  . Not on file  Social History Narrative  . Not on file   Family History: Family History  Problem Relation Age of Onset  . Breast cancer Maternal Aunt    Allergies: Allergies  Allergen Reactions  . Mangifera Indica Swelling    Throat swelled Throat swelled  . Pregabalin Swelling and Rash  . Tetracyclines & Related    Medications: See med rec.  Review of Systems: No fevers, chills, night sweats, weight loss, chest pain, or shortness of breath.   Objective:    General: Well Developed, well nourished, and in no acute distress.  Neuro: Alert and oriented x3, extra-ocular muscles intact, sensation grossly intact.  HEENT: Normocephalic, atraumatic, pupils equal round reactive to light, neck supple, no masses, no lymphadenopathy, thyroid nonpalpable.  Skin: Warm and dry, no rashes. Cardiac: Regular rate and rhythm, no murmurs rubs or gallops, no lower extremity edema.  Respiratory: Clear to auscultation bilaterally. Not using accessory muscles, speaking in full sentences.  Procedure: Real-time Ultrasound Guided Injection of right subacromial bursa Device: GE Logiq E  Verbal informed consent obtained.  Time-out conducted.  Noted no overlying erythema, induration, or other signs of local infection.  Skin prepped in a sterile fashion.  Local anesthesia: Topical Ethyl chloride.  With sterile technique and under real time ultrasound guidance: Noted no full-thickness or retracted tears, injected 1 cc Kenalog 40, 1 cc lidocaine, 1 cc bupivacaine into the subacromial bursa. Completed without difficulty  Pain immediately resolved suggesting accurate placement of the medication.  Advised to call if fevers/chills, erythema, induration, drainage, or persistent bleeding.  Images permanently stored and available for review in the ultrasound unit.  Impression: Technically successful ultrasound guided  injection.  Impression and Recommendations:    Impingement syndrome of right shoulder Subacromial injection as above, continue rehab exercises, return in 1 month for this.  Obesity Good weight loss after the initial month, continues with nutrition. Plateaued over this past month, entering the third month, increase Topamax to 100 twice a day, refilling phentermine, she has discontinued Belviq. Goal is 150 pounds by the time her vacation arrives.  Travel advice encounter Traveling to some first world countries in [place], adding hep a and B titers before considering Twinrix. Adding scopolamine patches, as well as a couple of Valium to knock her out on the plane. ___________________________________________ Gwen Her. Dianah Field, M.D., ABFM., CAQSM. Primary Care and [place] Instructor of [place] [employer] [facility] of Medicine

## 2018-07-27 ENCOUNTER — Ambulatory Visit: Payer: BLUE CROSS/BLUE SHIELD | Admitting: Sports Medicine

## 2018-08-10 DIAGNOSIS — R110 Nausea: Secondary | ICD-10-CM | POA: Diagnosis not present

## 2018-08-10 DIAGNOSIS — Z6825 Body mass index (BMI) 25.0-25.9, adult: Secondary | ICD-10-CM | POA: Diagnosis not present

## 2018-08-10 DIAGNOSIS — R1013 Epigastric pain: Secondary | ICD-10-CM | POA: Diagnosis not present

## 2018-08-10 DIAGNOSIS — H9190 Unspecified hearing loss, unspecified ear: Secondary | ICD-10-CM | POA: Diagnosis not present

## 2018-08-10 DIAGNOSIS — K8020 Calculus of gallbladder without cholecystitis without obstruction: Secondary | ICD-10-CM | POA: Diagnosis not present

## 2018-08-10 DIAGNOSIS — K219 Gastro-esophageal reflux disease without esophagitis: Secondary | ICD-10-CM | POA: Diagnosis not present

## 2018-08-10 DIAGNOSIS — M950 Acquired deformity of nose: Secondary | ICD-10-CM | POA: Diagnosis not present

## 2018-08-10 DIAGNOSIS — C439 Malignant melanoma of skin, unspecified: Secondary | ICD-10-CM | POA: Diagnosis not present

## 2018-08-11 DIAGNOSIS — K259 Gastric ulcer, unspecified as acute or chronic, without hemorrhage or perforation: Secondary | ICD-10-CM | POA: Diagnosis not present

## 2018-08-11 DIAGNOSIS — K2950 Unspecified chronic gastritis without bleeding: Secondary | ICD-10-CM | POA: Diagnosis not present

## 2018-08-11 DIAGNOSIS — K317 Polyp of stomach and duodenum: Secondary | ICD-10-CM | POA: Diagnosis not present

## 2018-08-11 DIAGNOSIS — K3189 Other diseases of stomach and duodenum: Secondary | ICD-10-CM | POA: Diagnosis not present

## 2018-08-11 DIAGNOSIS — K219 Gastro-esophageal reflux disease without esophagitis: Secondary | ICD-10-CM | POA: Diagnosis not present

## 2018-08-19 DIAGNOSIS — K760 Fatty (change of) liver, not elsewhere classified: Secondary | ICD-10-CM | POA: Diagnosis not present

## 2018-08-19 DIAGNOSIS — R110 Nausea: Secondary | ICD-10-CM | POA: Diagnosis not present

## 2018-08-19 DIAGNOSIS — K8020 Calculus of gallbladder without cholecystitis without obstruction: Secondary | ICD-10-CM | POA: Diagnosis not present

## 2018-08-19 DIAGNOSIS — R1013 Epigastric pain: Secondary | ICD-10-CM | POA: Diagnosis not present

## 2018-08-19 DIAGNOSIS — K219 Gastro-esophageal reflux disease without esophagitis: Secondary | ICD-10-CM | POA: Diagnosis not present

## 2018-08-22 DIAGNOSIS — K8020 Calculus of gallbladder without cholecystitis without obstruction: Secondary | ICD-10-CM | POA: Diagnosis not present

## 2018-08-24 DIAGNOSIS — E890 Postprocedural hypothyroidism: Secondary | ICD-10-CM | POA: Diagnosis not present

## 2018-08-24 DIAGNOSIS — I10 Essential (primary) hypertension: Secondary | ICD-10-CM | POA: Diagnosis not present

## 2018-08-24 DIAGNOSIS — K8010 Calculus of gallbladder with chronic cholecystitis without obstruction: Secondary | ICD-10-CM | POA: Diagnosis not present

## 2018-08-24 DIAGNOSIS — Z881 Allergy status to other antibiotic agents status: Secondary | ICD-10-CM | POA: Diagnosis not present

## 2018-08-24 DIAGNOSIS — Z85820 Personal history of malignant melanoma of skin: Secondary | ICD-10-CM | POA: Diagnosis not present

## 2018-08-24 DIAGNOSIS — R1013 Epigastric pain: Secondary | ICD-10-CM | POA: Diagnosis not present

## 2018-08-24 DIAGNOSIS — Z85850 Personal history of malignant neoplasm of thyroid: Secondary | ICD-10-CM | POA: Diagnosis not present

## 2018-08-24 DIAGNOSIS — K219 Gastro-esophageal reflux disease without esophagitis: Secondary | ICD-10-CM | POA: Diagnosis not present

## 2018-08-24 DIAGNOSIS — Z79899 Other long term (current) drug therapy: Secondary | ICD-10-CM | POA: Diagnosis not present

## 2018-08-24 DIAGNOSIS — K8020 Calculus of gallbladder without cholecystitis without obstruction: Secondary | ICD-10-CM | POA: Diagnosis not present

## 2018-08-24 DIAGNOSIS — Z888 Allergy status to other drugs, medicaments and biological substances status: Secondary | ICD-10-CM | POA: Diagnosis not present

## 2018-08-29 DIAGNOSIS — M7581 Other shoulder lesions, right shoulder: Secondary | ICD-10-CM | POA: Diagnosis not present

## 2018-08-29 DIAGNOSIS — H902 Conductive hearing loss, unspecified: Secondary | ICD-10-CM | POA: Diagnosis not present

## 2018-08-29 DIAGNOSIS — Z6824 Body mass index (BMI) 24.0-24.9, adult: Secondary | ICD-10-CM | POA: Diagnosis not present

## 2018-08-29 DIAGNOSIS — M5416 Radiculopathy, lumbar region: Secondary | ICD-10-CM | POA: Diagnosis not present

## 2018-08-29 DIAGNOSIS — H6980 Other specified disorders of Eustachian tube, unspecified ear: Secondary | ICD-10-CM | POA: Diagnosis not present

## 2018-09-01 DIAGNOSIS — Z9049 Acquired absence of other specified parts of digestive tract: Secondary | ICD-10-CM | POA: Diagnosis not present

## 2018-09-01 DIAGNOSIS — R109 Unspecified abdominal pain: Secondary | ICD-10-CM | POA: Diagnosis not present

## 2018-09-02 ENCOUNTER — Other Ambulatory Visit: Payer: Self-pay | Admitting: Sports Medicine

## 2018-09-02 DIAGNOSIS — I10 Essential (primary) hypertension: Secondary | ICD-10-CM

## 2018-09-05 ENCOUNTER — Ambulatory Visit (INDEPENDENT_AMBULATORY_CARE_PROVIDER_SITE_OTHER): Payer: BLUE CROSS/BLUE SHIELD | Admitting: Sports Medicine

## 2018-09-05 DIAGNOSIS — Z9049 Acquired absence of other specified parts of digestive tract: Secondary | ICD-10-CM | POA: Diagnosis not present

## 2018-09-05 DIAGNOSIS — M7541 Impingement syndrome of right shoulder: Secondary | ICD-10-CM

## 2018-09-05 DIAGNOSIS — E6609 Other obesity due to excess calories: Secondary | ICD-10-CM | POA: Diagnosis not present

## 2018-09-05 DIAGNOSIS — M7542 Impingement syndrome of left shoulder: Secondary | ICD-10-CM

## 2018-09-05 MED ORDER — PHENTERMINE HCL 37.5 MG PO TABS: ORAL_TABLET | ORAL | 0 refills | Status: DC

## 2018-09-05 MED ORDER — TOPIRAMATE 100 MG PO TABS: 100.0000 mg | ORAL_TABLET | Freq: Two times a day (BID) | ORAL | 3 refills | Status: DC

## 2018-09-05 NOTE — Assessment & Plan Note (Signed)
10 pound weight loss since last month, and it looks like she has been out of phentermine for a week or so. We increased to 100 mg of Topamax twice a day. Continue this, refilling phentermine. Entering the second month.

## 2018-09-05 NOTE — Progress Notes (Signed)
Subjective:    CC: Shoulder pain, weight loss, foot issue  HPI: Shoulder pain: Right subacromial injection at the last visit initially provided good relief, she did then have a urgent laparoscopic cholecystectomy, had a recurrence of pain in both shoulders afterwards.  Left-sided is localized over the deltoid and worse with abduction.  Moderate, persistent without radiation.  Obesity: 10 pound weight loss after the first month on phentermine, we did increase her Topamax 200 mill grams twice a day.  Laparoscopic cholecystectomy: Still with epigastric discomfort, currently on Carafate prescribed by gastroenterology.  I reviewed the past medical history, family history, social history, surgical history, and allergies today and no changes were needed.  Please see the problem list section below in epic for further details.  Past Medical History: Past Medical History:  Diagnosis Date  . Cancer ([place])   . Diabetes mellitus without complication ([place])   . Herniated disc   . Hypertension    Past Surgical History: Past Surgical History:  Procedure Laterality Date  . THYROIDECTOMY     Social History: Social History   Socioeconomic History  . Marital status: Married    Spouse name: Not on file  . Number of children: Not on file  . Years of education: Not on file  . Highest education level: Not on file  Occupational History  . Not on file  Social Needs  . Financial resource strain: Not on file  . Food insecurity:    Worry: Not on file    Inability: Not on file  . Transportation needs:    Medical: Not on file    Non-medical: Not on file  Tobacco Use  . Smoking status: Never Smoker  . Smokeless tobacco: Never Used  Substance and Sexual Activity  . Alcohol use: No  . Drug use: No  . Sexual activity: Not on file  Lifestyle  . Physical activity:    Days per week: Not on file    Minutes per session: Not on file  . Stress: Not on file  Relationships  . Social connections:   Talks on phone: Not on file    Gets together: Not on file    Attends religious service: Not on file    Active member of club or organization: Not on file    Attends meetings of clubs or organizations: Not on file    Relationship status: Not on file  Other Topics Concern  . Not on file  Social History Narrative  . Not on file   Family History: Family History  Problem Relation Age of Onset  . Breast cancer Maternal Aunt    Allergies: Allergies  Allergen Reactions  . Mangifera Indica Swelling    Throat swelled Throat swelled  . Pregabalin Swelling and Rash  . Tetracyclines & Related    Medications: See med rec.  Review of Systems: No fevers, chills, night sweats, weight loss, chest pain, or shortness of breath.   Objective:    General: Well Developed, well nourished, and in no acute distress.  Neuro: Alert and oriented x3, extra-ocular muscles intact, sensation grossly intact.  HEENT: Normocephalic, atraumatic, pupils equal round reactive to light, neck supple, no masses, no lymphadenopathy, thyroid nonpalpable.  Skin: Warm and dry, no rashes. Cardiac: Regular rate and rhythm, no murmurs rubs or gallops, no lower extremity edema.  Respiratory: Clear to auscultation bilaterally. Not using accessory muscles, speaking in full sentences. Left shoulder: Inspection reveals no abnormalities, atrophy or asymmetry. Palpation is normal with no tenderness over AC joint  or bicipital groove. ROM is full in all planes. Rotator cuff strength normal throughout. Positive Neer and Hawkin's tests, empty can. Speeds and Yergason's tests normal. No labral pathology noted with negative Obrien's, negative crank, negative clunk, and good stability. Normal scapular function observed. No painful arc and no drop arm sign. No apprehension sign  Procedure: Real-time Ultrasound Guided Injection of left subacromial bursa Device: GE Logiq E  Verbal informed consent obtained.  Time-out conducted.    Noted no overlying erythema, induration, or other signs of local infection.  Skin prepped in a sterile fashion.  Local anesthesia: Topical Ethyl chloride.  With sterile technique and under real time ultrasound guidance: 1 cc, 40, 1 cc lidocaine, 1 cc bupivacaine injected easily. Completed without difficulty  Pain immediately resolved suggesting accurate placement of the medication.  Advised to call if fevers/chills, erythema, induration, drainage, or persistent bleeding.  Images permanently stored and available for review in the ultrasound unit.  Impression: Technically successful ultrasound guided injection.  Impression and Recommendations:    Obesity 10 pound weight loss since last month, and it looks like she has been out of phentermine for a week or so. We increased to 100 mg of Topamax twice a day. Continue this, refilling phentermine. Entering the second month.   Impingement syndrome of both shoulders Bilateral shoulder pain, right side subacromial bursa was injected a month ago, recurrence of pain likely from positioning during her lap or scopic cholecystectomy. Left side injected today. Return in a month.  I spent 40 minutes with this patient, greater than 50% was face-to-face time counseling regarding the above diagnoses, specifically answering multiple questions regarding cholecystectomy, this time was separate from the time spent performing the above procedure ___________________________________________ Gwen Her. Dianah Field, M.D., ABFM., CAQSM. Primary Care and Sports Medicine [facility]  Adjunct Professor of [place] [employer] [facility] of Medicine

## 2018-09-05 NOTE — Assessment & Plan Note (Signed)
Bilateral shoulder pain, right side subacromial bursa was injected a month ago, recurrence of pain likely from positioning during her lap or scopic cholecystectomy. Left side injected today. Return in a month.

## 2018-09-06 ENCOUNTER — Other Ambulatory Visit: Payer: Self-pay | Admitting: *Deleted

## 2018-09-06 ENCOUNTER — Telehealth: Payer: Self-pay | Admitting: Sports Medicine

## 2018-09-06 DIAGNOSIS — I10 Essential (primary) hypertension: Secondary | ICD-10-CM

## 2018-09-06 MED ORDER — AMLODIPINE BESYLATE 5 MG PO TABS: 5.0000 mg | ORAL_TABLET | Freq: Every day | ORAL | 0 refills | Status: DC

## 2018-09-06 NOTE — Telephone Encounter (Signed)
Nancy Poole called stating she is still having issues with the right foot orthotic. She is still having a lot of pain in the arch of her foot. She was wondering if you could make another adjustment to the orthotic. She was hoping maybe you could squeeze her in today, because she is leaving for [place] on Thursday. I told her I would speak to you and call her back.

## 2018-09-06 NOTE — Telephone Encounter (Signed)
Have her try to just wear the regular insole in the shoe for a week and then switch back to the orthotic.  The issue may not be that the orthotic arch is too high, it may in fact be too low now.  She can come pick up some small scaphoid pads if she would desire.

## 2018-09-06 NOTE — Telephone Encounter (Signed)
I spoke with Nancy Poole. She asked if she were to get the scaphoid pads should she  wear them along with the orthotic or just the insole? She is concerned because she will be doing a lot of walking while out of the country for 2 weeks.

## 2018-09-06 NOTE — Telephone Encounter (Signed)
I want her to try regular insoles for a few days, if this does not help at all she should put the scaphoid pads on the orthotics.  Unfortunately with her vacation coming up very soon it is too soon to work a miracle with her feet.

## 2018-09-19 DIAGNOSIS — L989 Disorder of the skin and subcutaneous tissue, unspecified: Secondary | ICD-10-CM | POA: Diagnosis not present

## 2018-09-19 DIAGNOSIS — M950 Acquired deformity of nose: Secondary | ICD-10-CM | POA: Diagnosis not present

## 2018-09-19 DIAGNOSIS — J3489 Other specified disorders of nose and nasal sinuses: Secondary | ICD-10-CM | POA: Diagnosis not present

## 2018-09-19 DIAGNOSIS — C44311 Basal cell carcinoma of skin of nose: Secondary | ICD-10-CM | POA: Diagnosis not present

## 2018-09-19 DIAGNOSIS — J310 Chronic rhinitis: Secondary | ICD-10-CM | POA: Diagnosis not present

## 2018-09-22 ENCOUNTER — Other Ambulatory Visit: Payer: Self-pay | Admitting: Sports Medicine

## 2018-09-22 DIAGNOSIS — E6609 Other obesity due to excess calories: Secondary | ICD-10-CM

## 2018-09-22 DIAGNOSIS — E119 Type 2 diabetes mellitus without complications: Secondary | ICD-10-CM

## 2018-09-22 MED ORDER — TOPIRAMATE 100 MG PO TABS: 100.0000 mg | ORAL_TABLET | Freq: Two times a day (BID) | ORAL | 3 refills | Status: DC

## 2018-09-22 MED ORDER — DAPAGLIFLOZIN PRO-METFORMIN ER 5-500 MG PO TB24: 1.0000 | ORAL_TABLET | Freq: Every day | ORAL | 11 refills | Status: DC

## 2018-09-27 ENCOUNTER — Other Ambulatory Visit: Payer: Self-pay | Admitting: Sports Medicine

## 2018-09-27 DIAGNOSIS — M7581 Other shoulder lesions, right shoulder: Secondary | ICD-10-CM | POA: Diagnosis not present

## 2018-09-27 DIAGNOSIS — E119 Type 2 diabetes mellitus without complications: Secondary | ICD-10-CM

## 2018-09-27 DIAGNOSIS — M5416 Radiculopathy, lumbar region: Secondary | ICD-10-CM | POA: Diagnosis not present

## 2018-09-27 DIAGNOSIS — E6609 Other obesity due to excess calories: Secondary | ICD-10-CM

## 2018-09-27 MED ORDER — DAPAGLIFLOZIN PRO-METFORMIN ER 5-500 MG PO TB24: 1.0000 | ORAL_TABLET | Freq: Every day | ORAL | 3 refills | Status: DC

## 2018-09-27 MED ORDER — TOPIRAMATE 100 MG PO TABS: 100.0000 mg | ORAL_TABLET | Freq: Two times a day (BID) | ORAL | 3 refills | Status: DC

## 2018-10-04 ENCOUNTER — Encounter: Payer: Self-pay | Admitting: Sports Medicine

## 2018-10-04 ENCOUNTER — Ambulatory Visit (INDEPENDENT_AMBULATORY_CARE_PROVIDER_SITE_OTHER): Payer: BLUE CROSS/BLUE SHIELD | Admitting: Sports Medicine

## 2018-10-04 DIAGNOSIS — E6609 Other obesity due to excess calories: Secondary | ICD-10-CM

## 2018-10-04 MED ORDER — PHENTERMINE HCL 37.5 MG PO TABS: ORAL_TABLET | ORAL | 0 refills | Status: DC

## 2018-10-04 NOTE — Assessment & Plan Note (Signed)
Additional good weight loss. Entering the third month, Topamax 100 twice a day and phentermine. Goal weight is 135 pounds. She is going to add resistance training when her general surgeon clears her from her cholecystectomy.

## 2018-10-04 NOTE — Progress Notes (Signed)
  Subjective:    CC: Follow-up  HPI: Obesity: Good weight loss.  Shoulders: Also doing better.  Did a subacromial injection at the last visit.  I reviewed the past medical history, family history, social history, surgical history, and allergies today and no changes were needed.  Please see the problem list section below in epic for further details.  Past Medical History: Past Medical History:  Diagnosis Date  . Cancer ([place])   . Diabetes mellitus without complication ([place])   . Herniated disc   . Hypertension    Past Surgical History: Past Surgical History:  Procedure Laterality Date  . THYROIDECTOMY     Social History: Social History   Socioeconomic History  . Marital status: Married    Spouse name: Not on file  . Number of children: Not on file  . Years of education: Not on file  . Highest education level: Not on file  Occupational History  . Not on file  Social Needs  . Financial resource strain: Not on file  . Food insecurity:    Worry: Not on file    Inability: Not on file  . Transportation needs:    Medical: Not on file    Non-medical: Not on file  Tobacco Use  . Smoking status: Never Smoker  . Smokeless tobacco: Never Used  Substance and Sexual Activity  . Alcohol use: No  . Drug use: No  . Sexual activity: Not on file  Lifestyle  . Physical activity:    Days per week: Not on file    Minutes per session: Not on file  . Stress: Not on file  Relationships  . Social connections:    Talks on phone: Not on file    Gets together: Not on file    Attends religious service: Not on file    Active member of club or organization: Not on file    Attends meetings of clubs or organizations: Not on file    Relationship status: Not on file  Other Topics Concern  . Not on file  Social History Narrative  . Not on file   Family History: Family History  Problem Relation Age of Onset  . Breast cancer Maternal Aunt    Allergies: Allergies  Allergen Reactions    . Mangifera Indica Swelling    Throat swelled Throat swelled  . Pregabalin Swelling and Rash  . Tetracyclines & Related    Medications: See med rec.  Review of Systems: No fevers, chills, night sweats, weight loss, chest pain, or shortness of breath.   Objective:    General: Well Developed, well nourished, and in no acute distress.  Neuro: Alert and oriented x3, extra-ocular muscles intact, sensation grossly intact.  HEENT: Normocephalic, atraumatic, pupils equal round reactive to light, neck supple, no masses, no lymphadenopathy, thyroid nonpalpable.  Skin: Warm and dry, no rashes. Cardiac: Regular rate and rhythm, no murmurs rubs or gallops, no lower extremity edema.  Respiratory: Clear to auscultation bilaterally. Not using accessory muscles, speaking in full sentences.  Impression and Recommendations:    Obesity Additional good weight loss. Entering the third month, Topamax 100 twice a day and phentermine. Goal weight is 135 pounds. She is going to add resistance training when her general surgeon clears her from her cholecystectomy. ___________________________________________ Gwen Her. Dianah Field, M.D., ABFM., CAQSM. Primary Care and Sports Medicine [facility]  Adjunct Professor of [place] [employer] [facility] of Medicine

## 2018-10-19 DIAGNOSIS — H6980 Other specified disorders of Eustachian tube, unspecified ear: Secondary | ICD-10-CM | POA: Diagnosis not present

## 2018-10-19 DIAGNOSIS — H902 Conductive hearing loss, unspecified: Secondary | ICD-10-CM | POA: Diagnosis not present

## 2018-10-19 DIAGNOSIS — Z6824 Body mass index (BMI) 24.0-24.9, adult: Secondary | ICD-10-CM | POA: Diagnosis not present

## 2018-10-24 DIAGNOSIS — M9903 Segmental and somatic dysfunction of lumbar region: Secondary | ICD-10-CM | POA: Diagnosis not present

## 2018-10-24 DIAGNOSIS — Z1231 Encounter for screening mammogram for malignant neoplasm of breast: Secondary | ICD-10-CM | POA: Diagnosis not present

## 2018-10-24 DIAGNOSIS — M5126 Other intervertebral disc displacement, lumbar region: Secondary | ICD-10-CM | POA: Diagnosis not present

## 2018-10-24 DIAGNOSIS — M76821 Posterior tibial tendinitis, right leg: Secondary | ICD-10-CM | POA: Diagnosis not present

## 2018-10-24 DIAGNOSIS — M5416 Radiculopathy, lumbar region: Secondary | ICD-10-CM | POA: Diagnosis not present

## 2018-10-24 LAB — HM MAMMOGRAPHY

## 2018-10-25 IMAGING — MR MR FOOT*R* W/O CM
5 series · 40 of 40 positions shown · non-contrast
Comparison: Radiographs dated 04/11/2018

CLINICAL DATA: Right foot pain after an injury 2.5 months ago.

EXAM:
MRI OF THE RIGHT FOREFOOT WITHOUT CONTRAST
TECHNIQUE: Multiplanar, multisequence MR imaging of the right forefoot was
performed. No intravenous contrast was administered.

[Series 4: T1 · coronal · 3.0mm · 0.53mm/px · 11 of 56 slices shown (1 of 2)]
[im 1/56]
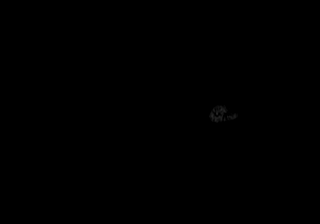
[im 6/56]
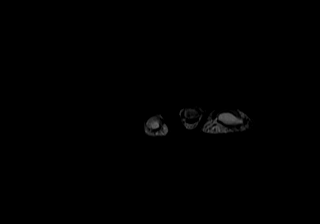
[im 12/56]
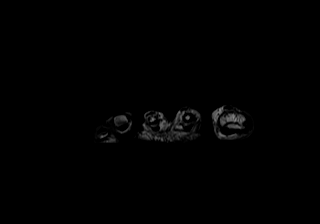
[im 17/56]
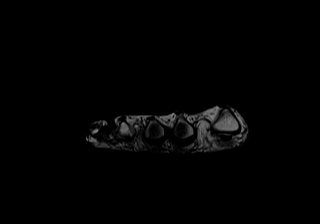
[im 23/56]
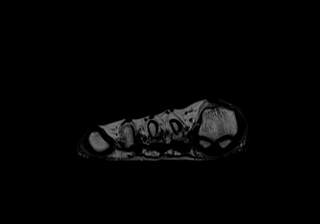
[im 28/56]
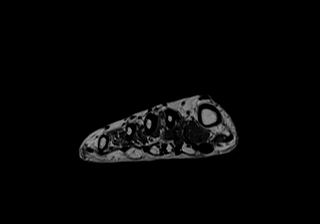
[im 34/56]
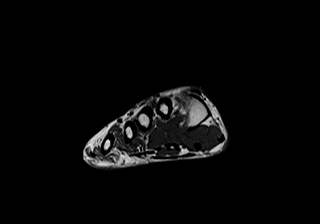
[im 39/56]
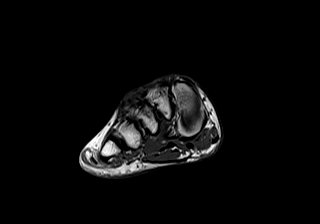
[im 45/56]
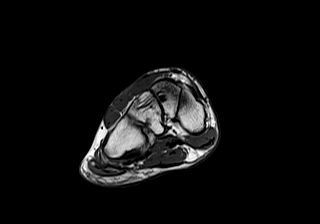
[im 50/56]
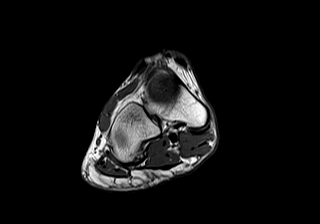
[im 56/56]
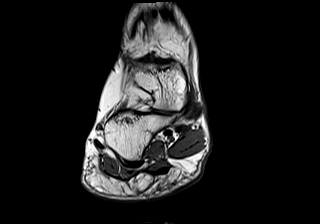

[Series 5: T2 fat-sat · coronal · 3.0mm · 0.53mm/px · 11 of 56 slices shown (1 of 2)]
[im 1/56]
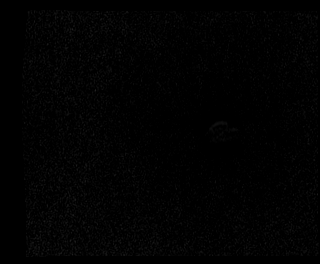
[im 6/56]
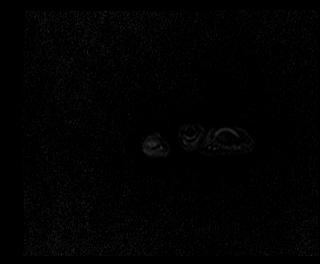
[im 12/56]
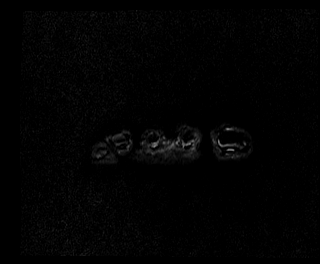
[im 17/56]
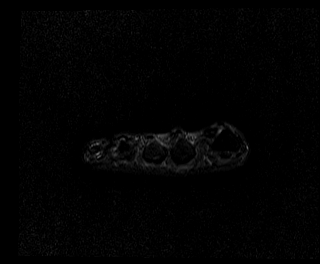
[im 23/56]
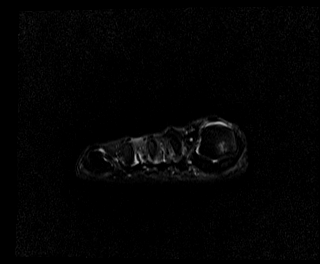
[im 28/56]
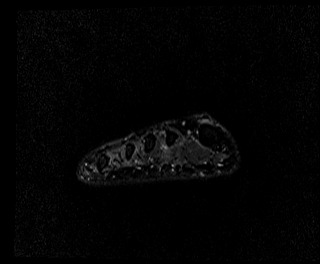
[im 34/56]
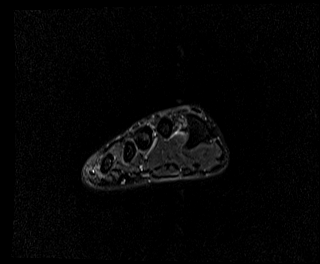
[im 39/56]
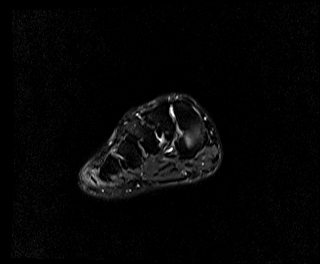
[im 45/56]
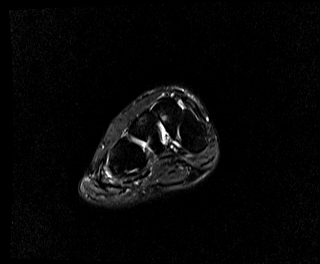
[im 50/56]
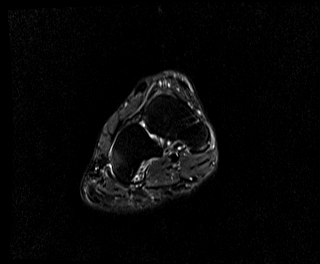
[im 56/56]
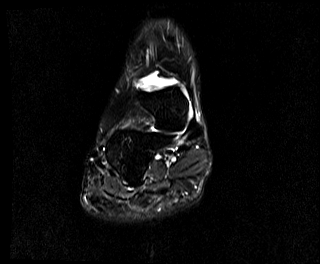

[Series 6: T1 · axial · 3.0mm · 0.56mm/px · z∈[-154,-71]mm · 6 of 27 slices shown (2 of 2)]
[im 1/27]
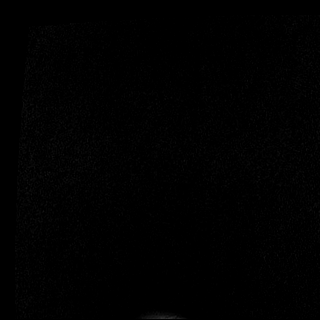
[im 6/27]
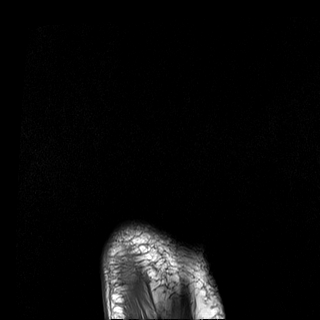
[im 11/27]
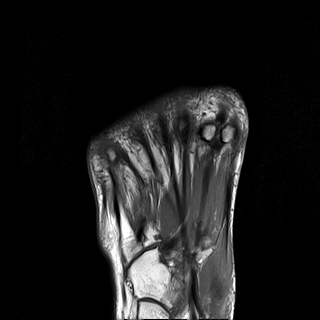
[im 16/27]
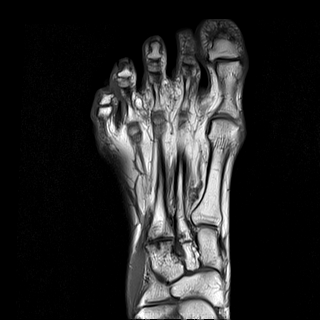
[im 21/27]
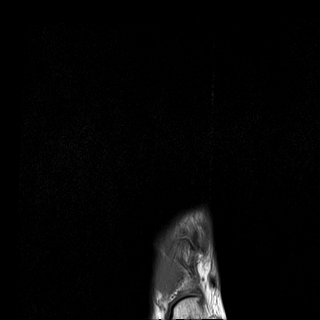
[im 27/27]
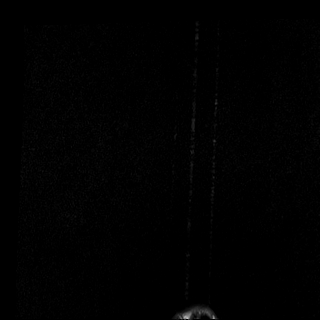

[Series 7: T2 fat-sat · axial · 3.0mm · 0.70mm/px · z∈[-154,-71]mm · 6 of 27 slices shown (2 of 2)]
[im 1/27]
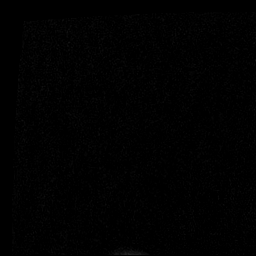
[im 6/27]
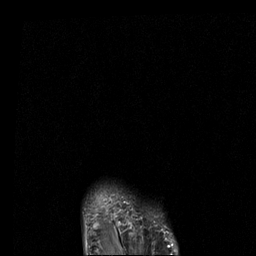
[im 11/27]
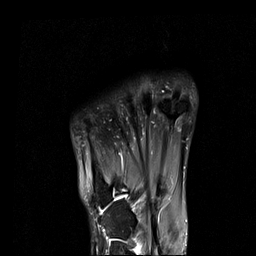
[im 16/27]
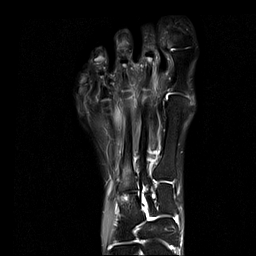
[im 21/27]
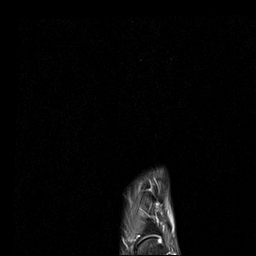
[im 27/27]
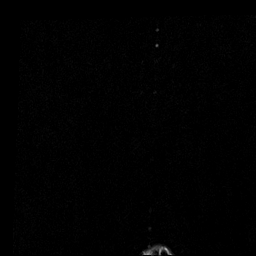

[Series 8: STIR · sagittal · 3.0mm · 0.70mm/px · 6 of 29 slices shown]
[im 1/29]
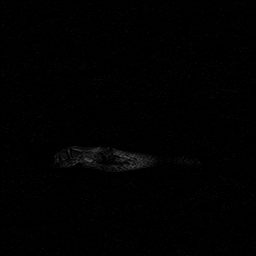
[im 6/29]
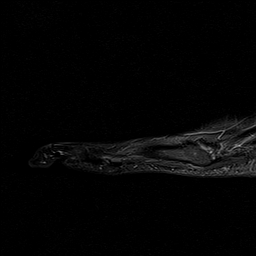
[im 12/29]
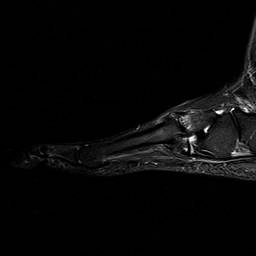
[im 17/29]
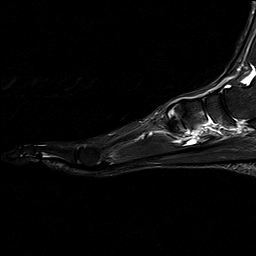
[im 23/29]
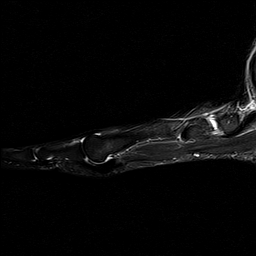
[im 29/29]
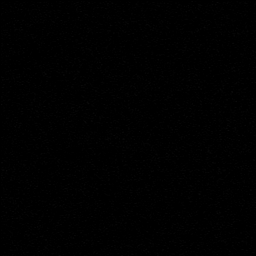

[40 of 40 positions shown; findings below may reference images not displayed]

FINDINGS: Bones/Joint/Cartilage

There is focal fairly severe arthritis of the dorsal aspect of third
tarsal metatarsal joint. Tiny focal area of arthritis of the
proximal middle cuneiform. Bones of the midfoot and forefoot are
otherwise essentially normal.

Small ankle effusion.

Ligaments

Normal.

Muscles and Tendons

Slight edema around the abductor digiti minimi muscle and tendon of
the long the lateral aspect of the base and shaft of the fifth
metatarsal. Otherwise normal.

Soft tissues

Normal.
IMPRESSION: 1. Focal fairly severe arthritis of the dorsal aspect of the third
TMT joint.
2. Subtle edema of the abductor digiti minimi muscle which may
represent a strain.

## 2018-11-04 DIAGNOSIS — M76821 Posterior tibial tendinitis, right leg: Secondary | ICD-10-CM | POA: Diagnosis not present

## 2018-11-04 DIAGNOSIS — M5126 Other intervertebral disc displacement, lumbar region: Secondary | ICD-10-CM | POA: Diagnosis not present

## 2018-11-04 DIAGNOSIS — M5416 Radiculopathy, lumbar region: Secondary | ICD-10-CM | POA: Diagnosis not present

## 2018-11-04 DIAGNOSIS — M9903 Segmental and somatic dysfunction of lumbar region: Secondary | ICD-10-CM | POA: Diagnosis not present

## 2018-11-08 ENCOUNTER — Encounter: Payer: Self-pay | Admitting: Sports Medicine

## 2018-11-08 ENCOUNTER — Ambulatory Visit (INDEPENDENT_AMBULATORY_CARE_PROVIDER_SITE_OTHER): Payer: BLUE CROSS/BLUE SHIELD | Admitting: Sports Medicine

## 2018-11-08 DIAGNOSIS — R635 Abnormal weight gain: Secondary | ICD-10-CM

## 2018-11-08 DIAGNOSIS — M19072 Primary osteoarthritis, left ankle and foot: Secondary | ICD-10-CM | POA: Insufficient documentation

## 2018-11-08 DIAGNOSIS — E6609 Other obesity due to excess calories: Secondary | ICD-10-CM

## 2018-11-08 DIAGNOSIS — E119 Type 2 diabetes mellitus without complications: Secondary | ICD-10-CM | POA: Diagnosis not present

## 2018-11-08 DIAGNOSIS — L03032 Cellulitis of left toe: Secondary | ICD-10-CM | POA: Diagnosis not present

## 2018-11-08 LAB — HM DIABETES EYE EXAM

## 2018-11-08 MED ORDER — FLUCONAZOLE 150 MG PO TABS: 150.0000 mg | ORAL_TABLET | Freq: Once | ORAL | 0 refills | Status: AC

## 2018-11-08 MED ORDER — PHENTERMINE HCL 37.5 MG PO TABS: ORAL_TABLET | ORAL | 0 refills | Status: DC

## 2018-11-08 MED ORDER — DOXYCYCLINE HYCLATE 100 MG PO TABS: 100.0000 mg | ORAL_TABLET | Freq: Two times a day (BID) | ORAL | 0 refills | Status: AC

## 2018-11-08 MED ORDER — TOPIRAMATE 100 MG PO TABS: 100.0000 mg | ORAL_TABLET | Freq: Two times a day (BID) | ORAL | 3 refills | Status: DC

## 2018-11-08 NOTE — Assessment & Plan Note (Signed)
Very mild with a mild ingrown toenail. Adding doxycycline, Diflucan. She does have tetracyclines in her allergy list but does not recall any adverse effects. Anticipatory guidance given. She will start cutting her nails straight across to decrease ingrown.

## 2018-11-08 NOTE — Progress Notes (Signed)
Subjective:    CC: Several issues  HPI: Abnormal weight gain: 3 additional pound weight loss.  Toe pain: Right-sided, medial nail fold.  Pain is mild, persistent, localized at the medial and lateral nail folds without radiation.  I reviewed the past medical history, family history, social history, surgical history, and allergies today and no changes were needed.  Please see the problem list section below in epic for further details.  Past Medical History: Past Medical History:  Diagnosis Date  . Cancer ([place])   . Diabetes mellitus without complication ([place])   . Herniated disc   . Hypertension    Past Surgical History: Past Surgical History:  Procedure Laterality Date  . THYROIDECTOMY     Social History: Social History   Socioeconomic History  . Marital status: Married    Spouse name: Not on file  . Number of children: Not on file  . Years of education: Not on file  . Highest education level: Not on file  Occupational History  . Not on file  Social Needs  . Financial resource strain: Not on file  . Food insecurity:    Worry: Not on file    Inability: Not on file  . Transportation needs:    Medical: Not on file    Non-medical: Not on file  Tobacco Use  . Smoking status: Never Smoker  . Smokeless tobacco: Never Used  Substance and Sexual Activity  . Alcohol use: No  . Drug use: No  . Sexual activity: Not on file  Lifestyle  . Physical activity:    Days per week: Not on file    Minutes per session: Not on file  . Stress: Not on file  Relationships  . Social connections:    Talks on phone: Not on file    Gets together: Not on file    Attends religious service: Not on file    Active member of club or organization: Not on file    Attends meetings of clubs or organizations: Not on file    Relationship status: Not on file  Other Topics Concern  . Not on file  Social History Narrative  . Not on file   Family History: Family History  Problem Relation Age of  Onset  . Breast cancer Maternal Aunt    Allergies: Allergies  Allergen Reactions  . Mangifera Indica Swelling    Throat swelled Throat swelled  . Pregabalin Swelling and Rash   Medications: See med rec.  Review of Systems: No fevers, chills, night sweats, weight loss, chest pain, or shortness of breath.   Objective:    General: Well Developed, well nourished, and in no acute distress.  Neuro: Alert and oriented x3, extra-ocular muscles intact, sensation grossly intact.  HEENT: Normocephalic, atraumatic, pupils equal round reactive to light, neck supple, no masses, no lymphadenopathy, thyroid nonpalpable.  Skin: Warm and dry, no rashes. Cardiac: Regular rate and rhythm, no murmurs rubs or gallops, no lower extremity edema.  Respiratory: Clear to auscultation bilaterally. Not using accessory muscles, speaking in full sentences. Right foot: There is only the mildest of paronychia at the medial and lateral nail folds of the left great toe, mild ingrown toenail.  Impression and Recommendations:    Abnormal weight gain Good continued weight loss, entering the fourth month, Topamax 100 twice a day and phentermine. Goal continues to be 135 pounds. She is very close to being able to add resistance training, and seems as though she is going to be cleared for lifting  post cholecystectomy.  Paronychia of great toe, left Very mild with a mild ingrown toenail. Adding doxycycline, Diflucan. She does have tetracyclines in her allergy list but does not recall any adverse effects. Anticipatory guidance given. She will start cutting her nails straight across to decrease ingrown. ___________________________________________ Gwen Her. Dianah Field, M.D., ABFM., CAQSM. Primary Care and Sports Medicine [facility]  Adjunct Professor of [place] of [facility] of Medicine

## 2018-11-08 NOTE — Assessment & Plan Note (Signed)
Good continued weight loss, entering the fourth month, Topamax 100 twice a day and phentermine. Goal continues to be 135 pounds. She is very close to being able to add resistance training, and seems as though she is going to be cleared for lifting post cholecystectomy.

## 2018-11-17 ENCOUNTER — Encounter: Payer: Self-pay | Admitting: Sports Medicine

## 2018-11-18 ENCOUNTER — Encounter: Payer: Self-pay | Admitting: Sports Medicine

## 2018-11-24 ENCOUNTER — Telehealth: Payer: Self-pay | Admitting: Sports Medicine

## 2018-11-24 DIAGNOSIS — M5416 Radiculopathy, lumbar region: Secondary | ICD-10-CM | POA: Diagnosis not present

## 2018-11-24 DIAGNOSIS — M79675 Pain in left toe(s): Secondary | ICD-10-CM | POA: Diagnosis not present

## 2018-11-24 NOTE — Telephone Encounter (Signed)
Maybe have her take a picture of it and send it to me on my chart.  It needs to be very clear picture.

## 2018-11-24 NOTE — Telephone Encounter (Signed)
Patient called and stated that her toe was still red and she does not think the Doxycycline helps and wants to know if she needs to come back in or do you recommend something for the toe since its still red and hurts a little. Please advise.

## 2018-11-25 DIAGNOSIS — M9903 Segmental and somatic dysfunction of lumbar region: Secondary | ICD-10-CM | POA: Diagnosis not present

## 2018-11-25 DIAGNOSIS — M7989 Other specified soft tissue disorders: Secondary | ICD-10-CM

## 2018-11-25 DIAGNOSIS — M5126 Other intervertebral disc displacement, lumbar region: Secondary | ICD-10-CM | POA: Diagnosis not present

## 2018-11-25 DIAGNOSIS — M5416 Radiculopathy, lumbar region: Secondary | ICD-10-CM | POA: Diagnosis not present

## 2018-11-25 DIAGNOSIS — M76821 Posterior tibial tendinitis, right leg: Secondary | ICD-10-CM | POA: Diagnosis not present

## 2018-11-25 NOTE — Telephone Encounter (Signed)
Sent MyChart message for the patient to patient to send a picture of the affected area per PCP recommendations. I also left a message for patient to call our office with any questions.

## 2018-12-01 DIAGNOSIS — M7989 Other specified soft tissue disorders: Secondary | ICD-10-CM | POA: Insufficient documentation

## 2018-12-01 MED ORDER — DOXYCYCLINE HYCLATE 100 MG PO TABS: 100.0000 mg | ORAL_TABLET | Freq: Two times a day (BID) | ORAL | 0 refills | Status: AC

## 2018-12-01 MED ORDER — TERBINAFINE HCL 250 MG PO TABS: 250.0000 mg | ORAL_TABLET | Freq: Every day | ORAL | 1 refills | Status: AC

## 2018-12-01 NOTE — Assessment & Plan Note (Signed)
Swelling and pain in the right great toe. Likely mild paronychia with onychomycosis. Adding 7 days of doxycycline, 3 to 6 months of Lamisil.

## 2018-12-01 NOTE — Telephone Encounter (Signed)
I called the patient and gave her your recommendations and she does not want to take another week of the Doxycycline. She reports that she got a nasty yeast infection with the last prescription. She states she has tried the Lamisil in the past and it did not work. If she does have to go on this medication she wants more than 1 tablet of Diflucan. Do you have any recommendations? Please advise.

## 2018-12-05 ENCOUNTER — Other Ambulatory Visit: Payer: Self-pay | Admitting: Chiropractic Medicine

## 2018-12-05 DIAGNOSIS — M25551 Pain in right hip: Secondary | ICD-10-CM

## 2018-12-05 DIAGNOSIS — M2560 Stiffness of unspecified joint, not elsewhere classified: Secondary | ICD-10-CM

## 2018-12-06 MED ORDER — FLUCONAZOLE 150 MG PO TABS: 150.0000 mg | ORAL_TABLET | Freq: Once | ORAL | 3 refills | Status: AC

## 2018-12-06 NOTE — Addendum Note (Signed)
Addended by: Silverio Decamp on: 12/06/2018 12:06 PM   Modules accepted: Orders

## 2018-12-20 ENCOUNTER — Encounter: Payer: Self-pay | Admitting: Sports Medicine

## 2018-12-20 ENCOUNTER — Ambulatory Visit (INDEPENDENT_AMBULATORY_CARE_PROVIDER_SITE_OTHER): Payer: BLUE CROSS/BLUE SHIELD | Admitting: Sports Medicine

## 2018-12-20 DIAGNOSIS — E6609 Other obesity due to excess calories: Secondary | ICD-10-CM | POA: Diagnosis not present

## 2018-12-20 DIAGNOSIS — M19072 Primary osteoarthritis, left ankle and foot: Secondary | ICD-10-CM

## 2018-12-20 DIAGNOSIS — R635 Abnormal weight gain: Secondary | ICD-10-CM

## 2018-12-20 MED ORDER — PHENTERMINE HCL 37.5 MG PO TABS: ORAL_TABLET | ORAL | 0 refills | Status: DC

## 2018-12-20 NOTE — Progress Notes (Signed)
Subjective:    CC: Several issues  HPI: Obesity: Has lost a few more pounds, is close to her goal.  Foot pain: Left-sided, great toe, dorsally we have suspected a paronychia but she has been through a couple of weeks of doxycycline, terbinafine without any improvement.  On further questioning the first interphalangeal joint is somewhat swollen and tender.  I reviewed the past medical history, family history, social history, surgical history, and allergies today and no changes were needed.  Please see the problem list section below in epic for further details.  Past Medical History: Past Medical History:  Diagnosis Date  . Cancer ([place])   . Diabetes mellitus without complication ([place])   . Herniated disc   . Hypertension    Past Surgical History: Past Surgical History:  Procedure Laterality Date  . THYROIDECTOMY     Social History: Social History   Socioeconomic History  . Marital status: Married    Spouse name: Not on file  . Number of children: Not on file  . Years of education: Not on file  . Highest education level: Not on file  Occupational History  . Not on file  Social Needs  . Financial resource strain: Not on file  . Food insecurity:    Worry: Not on file    Inability: Not on file  . Transportation needs:    Medical: Not on file    Non-medical: Not on file  Tobacco Use  . Smoking status: Never Smoker  . Smokeless tobacco: Never Used  Substance and Sexual Activity  . Alcohol use: No  . Drug use: No  . Sexual activity: Not on file  Lifestyle  . Physical activity:    Days per week: Not on file    Minutes per session: Not on file  . Stress: Not on file  Relationships  . Social connections:    Talks on phone: Not on file    Gets together: Not on file    Attends religious service: Not on file    Active member of club or organization: Not on file    Attends meetings of clubs or organizations: Not on file    Relationship status: Not on file  Other Topics  Concern  . Not on file  Social History Narrative  . Not on file   Family History: Family History  Problem Relation Age of Onset  . Breast cancer Maternal Aunt    Allergies: Allergies  Allergen Reactions  . Mangifera Indica Swelling    Throat swelled Throat swelled  . Pregabalin Swelling and Rash   Medications: See med rec.  Review of Systems: No fevers, chills, night sweats, weight loss, chest pain, or shortness of breath.   Objective:    General: Well Developed, well nourished, and in no acute distress.  Neuro: Alert and oriented x3, extra-ocular muscles intact, sensation grossly intact.  HEENT: Normocephalic, atraumatic, pupils equal round reactive to light, neck supple, no masses, no lymphadenopathy, thyroid nonpalpable.  Skin: Warm and dry, no rashes. Cardiac: Regular rate and rhythm, no murmurs rubs or gallops, no lower extremity edema.  Respiratory: Clear to auscultation bilaterally. Not using accessory muscles, speaking in full sentences. Left foot: No visible erythema or swelling. Range of motion is full in all directions. Strength is 5/5 in all directions. No hallux valgus. No pes cavus or pes planus. No abnormal callus noted. No pain over the navicular prominence, or base of fifth metatarsal. No tenderness to palpation of the calcaneal insertion of plantar fascia.  No pain at the Achilles insertion. No pain over the calcaneal bursa. No pain of the retrocalcaneal bursa. No tenderness to palpation over the tarsals, metatarsals, or phalanges. No hallux rigidus or limitus. Mild tenderness at the first IP joint No pain with compression of the metatarsal heads. Neurovascularly intact distally.  Procedure: Real-time Ultrasound Guided Injection of left first IP joint Device: GE Logiq E  Verbal informed consent obtained.  Time-out conducted.  Noted no overlying erythema, induration, or other signs of local infection.  Skin prepped in a sterile fashion.  Local  anesthesia: Topical Ethyl chloride.  With sterile technique and under real time ultrasound guidance: 1/2 cc Kenalog 40, 1/2 cc lidocaine injected easily Completed without difficulty  Pain immediately resolved suggesting accurate placement of the medication.  Advised to call if fevers/chills, erythema, induration, drainage, or persistent bleeding.  Images permanently stored and available for review in the ultrasound unit.  Impression: Technically successful ultrasound guided injection.  Impression and Recommendations:    Primary osteoarthritis of left first IP joint We have treated this aggressively for paronychia, I think this is more coming from the IP joint, injection as above. Return in 1 month.  Abnormal weight gain Good additional weight loss. Refilling phentermine for 1 final month. Goal continues to be 135 pounds. ___________________________________________ Gwen Her. Dianah Field, M.D., ABFM., CAQSM. Primary Care and Sports Medicine [facility]  Adjunct Professor of [place] of [facility] [employer]

## 2018-12-20 NOTE — Assessment & Plan Note (Signed)
We have treated this aggressively for paronychia, I think this is more coming from the IP joint, injection as above. Return in 1 month.

## 2018-12-20 NOTE — Assessment & Plan Note (Signed)
Good additional weight loss. Refilling phentermine for 1 final month. Goal continues to be 135 pounds.

## 2019-01-16 ENCOUNTER — Other Ambulatory Visit: Payer: Self-pay | Admitting: Sports Medicine

## 2019-01-16 DIAGNOSIS — E119 Type 2 diabetes mellitus without complications: Secondary | ICD-10-CM

## 2019-01-16 MED ORDER — DAPAGLIFLOZIN PRO-METFORMIN ER 5-500 MG PO TB24: 1.0000 | ORAL_TABLET | Freq: Every day | ORAL | 3 refills | Status: DC

## 2019-01-27 ENCOUNTER — Encounter: Payer: Self-pay | Admitting: Sports Medicine

## 2019-01-27 DIAGNOSIS — R635 Abnormal weight gain: Secondary | ICD-10-CM

## 2019-01-27 DIAGNOSIS — E034 Atrophy of thyroid (acquired): Secondary | ICD-10-CM

## 2019-01-27 DIAGNOSIS — I10 Essential (primary) hypertension: Secondary | ICD-10-CM

## 2019-01-27 DIAGNOSIS — E6609 Other obesity due to excess calories: Secondary | ICD-10-CM

## 2019-01-27 MED ORDER — AMLODIPINE BESYLATE 5 MG PO TABS: 5.0000 mg | ORAL_TABLET | Freq: Every day | ORAL | 0 refills | Status: DC

## 2019-01-27 MED ORDER — LEVOTHYROXINE SODIUM 137 MCG PO TABS: ORAL_TABLET | ORAL | 3 refills | Status: DC

## 2019-01-27 MED ORDER — OMEPRAZOLE 40 MG PO CPDR: 40.0000 mg | DELAYED_RELEASE_CAPSULE | Freq: Every day | ORAL | 3 refills | Status: DC

## 2019-01-27 MED ORDER — TOPIRAMATE 100 MG PO TABS: 100.0000 mg | ORAL_TABLET | Freq: Two times a day (BID) | ORAL | 3 refills | Status: DC

## 2019-02-24 DIAGNOSIS — M7581 Other shoulder lesions, right shoulder: Secondary | ICD-10-CM | POA: Diagnosis not present

## 2019-02-24 DIAGNOSIS — M5416 Radiculopathy, lumbar region: Secondary | ICD-10-CM | POA: Diagnosis not present

## 2019-04-26 DIAGNOSIS — M5416 Radiculopathy, lumbar region: Secondary | ICD-10-CM | POA: Diagnosis not present

## 2019-04-26 DIAGNOSIS — M7581 Other shoulder lesions, right shoulder: Secondary | ICD-10-CM | POA: Diagnosis not present

## 2019-05-03 ENCOUNTER — Other Ambulatory Visit: Payer: Self-pay | Admitting: Sports Medicine

## 2019-05-03 DIAGNOSIS — E034 Atrophy of thyroid (acquired): Secondary | ICD-10-CM

## 2019-06-22 DIAGNOSIS — G894 Chronic pain syndrome: Secondary | ICD-10-CM | POA: Diagnosis not present

## 2019-06-22 DIAGNOSIS — M5416 Radiculopathy, lumbar region: Secondary | ICD-10-CM | POA: Diagnosis not present

## 2019-06-22 DIAGNOSIS — M961 Postlaminectomy syndrome, not elsewhere classified: Secondary | ICD-10-CM | POA: Diagnosis not present

## 2019-07-31 ENCOUNTER — Other Ambulatory Visit: Payer: Self-pay | Admitting: Sports Medicine

## 2019-07-31 DIAGNOSIS — E119 Type 2 diabetes mellitus without complications: Secondary | ICD-10-CM

## 2019-07-31 MED ORDER — XIGDUO XR 5-500 MG PO TB24: 1.0000 | ORAL_TABLET | Freq: Every day | ORAL | 0 refills | Status: DC

## 2019-07-31 NOTE — Telephone Encounter (Signed)
Patient called in and needed an updated PA. I sent this as an urgent and received an approval. Approvedtoday CaseId:[id number];Status:Approved;Review Type:Prior Auth;Coverage Start Date:07/01/2019;Coverage End Date:07/30/2020; Pharmacy aware.   I let the patient know that I received an approval and found a coupon for 30 days. I let her know that I was faxing the coupon card for the 30 days free. She was appreciative. I received confirmation that the card went through. Patient will check and see the price of her medicine at mail order pharmacy.

## 2019-09-18 ENCOUNTER — Telehealth: Payer: Self-pay | Admitting: Sports Medicine

## 2019-09-18 NOTE — Telephone Encounter (Signed)
Left patient a voicemail with information below. Let patient know to call us back to schedule an appointment in the office or a virtual visit.  °

## 2019-09-18 NOTE — Telephone Encounter (Signed)
-----   Message from Tasia Catchings, [place] sent at 09/18/2019  1:42 PM EST ----- Please call and have patient schedule a virtual with provider to get further refills. Thanks.

## 2019-09-22 ENCOUNTER — Encounter: Payer: Self-pay | Admitting: Sports Medicine

## 2019-09-22 ENCOUNTER — Ambulatory Visit (INDEPENDENT_AMBULATORY_CARE_PROVIDER_SITE_OTHER): Payer: BC Managed Care – PPO | Admitting: Sports Medicine

## 2019-09-22 ENCOUNTER — Other Ambulatory Visit: Payer: Self-pay

## 2019-09-22 VITALS — BP 119/78 | HR 65 | Ht 66.0 in | Wt 148.0 lb

## 2019-09-22 DIAGNOSIS — E119 Type 2 diabetes mellitus without complications: Secondary | ICD-10-CM | POA: Diagnosis not present

## 2019-09-22 DIAGNOSIS — Z0000 Encounter for general adult medical examination without abnormal findings: Secondary | ICD-10-CM

## 2019-09-22 DIAGNOSIS — E034 Atrophy of thyroid (acquired): Secondary | ICD-10-CM | POA: Diagnosis not present

## 2019-09-22 DIAGNOSIS — Z23 Encounter for immunization: Secondary | ICD-10-CM | POA: Diagnosis not present

## 2019-09-22 DIAGNOSIS — H6983 Other specified disorders of Eustachian tube, bilateral: Secondary | ICD-10-CM | POA: Diagnosis not present

## 2019-09-22 DIAGNOSIS — I10 Essential (primary) hypertension: Secondary | ICD-10-CM | POA: Diagnosis not present

## 2019-09-22 MED ORDER — OZEMPIC (0.25 OR 0.5 MG/DOSE) 2 MG/1.5ML ~~LOC~~ SOPN: 1.0000 | PEN_INJECTOR | SUBCUTANEOUS | 1 refills | Status: DC

## 2019-09-22 MED ORDER — LEVOTHYROXINE SODIUM 137 MCG PO TABS: ORAL_TABLET | ORAL | 3 refills | Status: DC

## 2019-09-22 MED ORDER — OZEMPIC (1 MG/DOSE) 2 MG/1.5ML ~~LOC~~ SOPN: 1.0000 mg | PEN_INJECTOR | SUBCUTANEOUS | 11 refills | Status: DC

## 2019-09-22 MED ORDER — FLUTICASONE PROPIONATE 50 MCG/ACT NA SUSP: NASAL | 3 refills | Status: DC

## 2019-09-22 MED ORDER — OMEPRAZOLE 40 MG PO CPDR: 40.0000 mg | DELAYED_RELEASE_CAPSULE | Freq: Every day | ORAL | 3 refills | Status: DC

## 2019-09-22 NOTE — Progress Notes (Addendum)
Subjective:    CC: Follow-up  HPI: Preventive measures: Up-to-date with the exception of Shingrix.  Diabetes mellitus type 2: Desires to switch from XigDuo to [employer].  I reviewed the past medical history, family history, social history, surgical history, and allergies today and no changes were needed.  Please see the problem list section below in epic for further details.  Past Medical History: Past Medical History:  Diagnosis Date  . Cancer ([place])   . Diabetes mellitus without complication ([place])   . Herniated disc   . Hypertension    Past Surgical History: Past Surgical History:  Procedure Laterality Date  . THYROIDECTOMY     Social History: Social History   Socioeconomic History  . Marital status: Married    Spouse name: Not on file  . Number of children: Not on file  . Years of education: Not on file  . Highest education level: Not on file  Occupational History  . Not on file  Social Needs  . Financial resource strain: Not on file  . Food insecurity    Worry: Not on file    Inability: Not on file  . Transportation needs    Medical: Not on file    Non-medical: Not on file  Tobacco Use  . Smoking status: Never Smoker  . Smokeless tobacco: Never Used  Substance and Sexual Activity  . Alcohol use: No  . Drug use: No  . Sexual activity: Not on file  Lifestyle  . Physical activity    Days per week: Not on file    Minutes per session: Not on file  . Stress: Not on file  Relationships  . Social Herbalist on phone: Not on file    Gets together: Not on file    Attends religious service: Not on file    Active member of club or organization: Not on file    Attends meetings of clubs or organizations: Not on file    Relationship status: Not on file  Other Topics Concern  . Not on file  Social History Narrative  . Not on file   Family History: Family History  Problem Relation Age of Onset  . Breast cancer Maternal Aunt    Allergies: Allergies   Allergen Reactions  . Mangifera Indica Swelling    Throat swelled Throat swelled  . Pregabalin Swelling and Rash   Medications: See med rec.  Review of Systems: No fevers, chills, night sweats, weight loss, chest pain, or shortness of breath.   Objective:    General: Well Developed, well nourished, and in no acute distress.  Neuro: Alert and oriented x3, extra-ocular muscles intact, sensation grossly intact.  HEENT: Normocephalic, atraumatic, pupils equal round reactive to light, neck supple, no masses, no lymphadenopathy, thyroid nonpalpable.  Skin: Warm and dry, no rashes. Cardiac: Regular rate and rhythm, no murmurs rubs or gallops, no lower extremity edema.  Respiratory: Clear to auscultation bilaterally. Not using accessory muscles, speaking in full sentences.  Impression and Recommendations:    Annual physical exam Shingrix today. She is up-to-date on flu, pneumococcal vaccination.  Diabetes mellitus, type 2 Checking routine labs, also switching to Ozempic. For coverage purposes we can use the diagnosis of metabolic syndrome, she has also been on metformin already. She will download a discount coupon.   Hypothyroidism TSH continues to be low, decreasing levothyroxine to 125 mcg. I spent 25 minutes with this patient, greater than 50% was face-to-face time counseling regarding the above diagnoses.  ___________________________________________ Nancy Poole  Ferdinand Lango, M.D., ABFM., CAQSM. Primary Care and Sports Medicine [facility]  Adjunct Professor of [place] [employer] [facility]

## 2019-09-22 NOTE — Assessment & Plan Note (Signed)
Shingrix today. She is up-to-date on flu, pneumococcal vaccination.

## 2019-09-22 NOTE — Assessment & Plan Note (Signed)
Checking routine labs, also switching to Ozempic. For coverage purposes we can use the diagnosis of metabolic syndrome, she has also been on metformin already. She will download a discount coupon.

## 2019-09-23 LAB — HEMOGLOBIN A1C
Hgb A1c MFr Bld: 5.9 % of total Hgb — ABNORMAL HIGH (ref <5.7)
Mean Plasma Glucose: 123 (calc)
eAG (mmol/L): 6.8 (calc)

## 2019-09-23 LAB — CBC
HCT: 37 % (ref 35.0–45.0)
Hemoglobin: 11.6 g/dL — ABNORMAL LOW (ref 11.7–15.5)
MCH: 25.7 pg — ABNORMAL LOW (ref 27.0–33.0)
MCHC: 31.4 g/dL — ABNORMAL LOW (ref 32.0–36.0)
MCV: 81.9 fL (ref 80.0–100.0)
MPV: 10.9 fL (ref 7.5–12.5)
Platelets: 263 10*3/uL (ref 140–400)
RBC: 4.52 10*6/uL (ref 3.80–5.10)
RDW: 14.2 % (ref 11.0–15.0)
WBC: 4.9 10*3/uL (ref 3.8–10.8)

## 2019-09-23 LAB — COMPLETE METABOLIC PANEL WITH GFR
AG Ratio: 1.6 (calc) (ref 1.0–2.5)
ALT: 14 U/L (ref 6–29)
AST: 18 U/L (ref 10–35)
Albumin: 4.1 g/dL (ref 3.6–5.1)
Alkaline phosphatase (APISO): 91 U/L (ref 37–153)
BUN: 14 mg/dL (ref 7–25)
CO2: 25 mmol/L (ref 20–32)
Calcium: 8.8 mg/dL (ref 8.6–10.4)
Chloride: 107 mmol/L (ref 98–110)
Creat: 0.99 mg/dL (ref 0.50–1.05)
GFR, Est African American: 75 mL/min/{1.73_m2} (ref >=60)
GFR, Est Non African American: 65 mL/min/{1.73_m2} (ref >=60)
Globulin: 2.5 g/dL (calc) (ref 1.9–3.7)
Glucose, Bld: 103 mg/dL — ABNORMAL HIGH (ref 65–99)
Potassium: 3.7 mmol/L (ref 3.5–5.3)
Sodium: 141 mmol/L (ref 135–146)
Total Bilirubin: 0.4 mg/dL (ref 0.2–1.2)
Total Protein: 6.6 g/dL (ref 6.1–8.1)

## 2019-09-23 LAB — LIPID PANEL W/REFLEX DIRECT LDL
Cholesterol: 208 mg/dL — ABNORMAL HIGH (ref <200)
HDL: 86 mg/dL (ref >=50)
LDL Cholesterol (Calc): 104 mg/dL (calc) — ABNORMAL HIGH
Non-HDL Cholesterol (Calc): 122 mg/dL (calc) (ref <130)
Total CHOL/HDL Ratio: 2.4 (calc) (ref <5.0)
Triglycerides: 87 mg/dL (ref <150)

## 2019-09-23 LAB — TSH: TSH: 0.03 mIU/L — ABNORMAL LOW

## 2019-09-23 LAB — MICROALBUMIN / CREATININE URINE RATIO
Creatinine, Urine: 185 mg/dL (ref 20–275)
Microalb Creat Ratio: 3 mcg/mg creat (ref <30)
Microalb, Ur: 0.6 mg/dL

## 2019-09-24 MED ORDER — LEVOTHYROXINE SODIUM 125 MCG PO TABS: 125.0000 ug | ORAL_TABLET | Freq: Every day | ORAL | 3 refills | Status: DC

## 2019-09-24 NOTE — Assessment & Plan Note (Signed)
TSH continues to be low, decreasing levothyroxine to 125 mcg.

## 2019-09-24 NOTE — Addendum Note (Signed)
Addended by: Silverio Decamp on: 09/24/2019 09:13 PM   Modules accepted: Orders

## 2019-09-28 DIAGNOSIS — M961 Postlaminectomy syndrome, not elsewhere classified: Secondary | ICD-10-CM | POA: Diagnosis not present

## 2019-09-28 DIAGNOSIS — M5416 Radiculopathy, lumbar region: Secondary | ICD-10-CM | POA: Diagnosis not present

## 2019-10-09 ENCOUNTER — Other Ambulatory Visit: Payer: Self-pay | Admitting: Sports Medicine

## 2019-10-09 DIAGNOSIS — E034 Atrophy of thyroid (acquired): Secondary | ICD-10-CM

## 2019-10-11 LAB — HM DIABETES EYE EXAM

## 2019-10-19 ENCOUNTER — Encounter: Payer: Self-pay | Admitting: Sports Medicine

## 2019-10-25 DIAGNOSIS — G894 Chronic pain syndrome: Secondary | ICD-10-CM | POA: Diagnosis not present

## 2019-10-25 DIAGNOSIS — M5416 Radiculopathy, lumbar region: Secondary | ICD-10-CM | POA: Diagnosis not present

## 2019-10-25 DIAGNOSIS — M961 Postlaminectomy syndrome, not elsewhere classified: Secondary | ICD-10-CM | POA: Diagnosis not present

## 2019-10-26 DIAGNOSIS — E034 Atrophy of thyroid (acquired): Secondary | ICD-10-CM

## 2019-10-26 DIAGNOSIS — E119 Type 2 diabetes mellitus without complications: Secondary | ICD-10-CM

## 2019-10-26 MED ORDER — OZEMPIC (1 MG/DOSE) 2 MG/1.5ML ~~LOC~~ SOPN: 1.0000 mg | PEN_INJECTOR | SUBCUTANEOUS | 11 refills | Status: DC

## 2019-10-31 DIAGNOSIS — Z1231 Encounter for screening mammogram for malignant neoplasm of breast: Secondary | ICD-10-CM | POA: Diagnosis not present

## 2019-10-31 LAB — HM MAMMOGRAPHY

## 2019-11-15 ENCOUNTER — Other Ambulatory Visit: Payer: Self-pay | Admitting: Sports Medicine

## 2019-11-15 DIAGNOSIS — I10 Essential (primary) hypertension: Secondary | ICD-10-CM

## 2019-11-16 DIAGNOSIS — Z5181 Encounter for therapeutic drug level monitoring: Secondary | ICD-10-CM | POA: Diagnosis not present

## 2019-11-16 DIAGNOSIS — E034 Atrophy of thyroid (acquired): Secondary | ICD-10-CM | POA: Diagnosis not present

## 2019-11-17 ENCOUNTER — Encounter: Payer: Self-pay | Admitting: Sports Medicine

## 2019-11-17 ENCOUNTER — Other Ambulatory Visit: Payer: Self-pay | Admitting: Sports Medicine

## 2019-11-17 DIAGNOSIS — E034 Atrophy of thyroid (acquired): Secondary | ICD-10-CM

## 2019-11-17 LAB — TSH: TSH: 0.04 mIU/L — ABNORMAL LOW

## 2019-11-17 MED ORDER — LEVOTHYROXINE SODIUM 112 MCG PO TABS: 112.0000 ug | ORAL_TABLET | Freq: Every day | ORAL | 3 refills | Status: DC

## 2019-11-17 NOTE — Assessment & Plan Note (Signed)
TSH is still low, this indicates levothyroxine dose is still too high.   Decreasing levothyroxine to 112 mcg, recheck in 6 weeks.

## 2019-11-22 ENCOUNTER — Ambulatory Visit (INDEPENDENT_AMBULATORY_CARE_PROVIDER_SITE_OTHER): Payer: BC Managed Care – PPO | Admitting: Sports Medicine

## 2019-11-22 ENCOUNTER — Other Ambulatory Visit: Payer: Self-pay

## 2019-11-22 VITALS — Temp 98.3°F | Ht 66.0 in | Wt 148.0 lb

## 2019-11-22 DIAGNOSIS — Z23 Encounter for immunization: Secondary | ICD-10-CM

## 2019-11-22 NOTE — Progress Notes (Signed)
Patient presents to clinic for her second shingles vaccine. She reports she had a little pain from the vaccine last time for a couple of days but then it went away. Patient tolerated the injection well in her left deltoid with no immediate complications. She reports I am going to take Tylenol or Advil if I need this for pain in my arm. No other questions.

## 2019-12-12 DIAGNOSIS — Z113 Encounter for screening for infections with a predominantly sexual mode of transmission: Secondary | ICD-10-CM | POA: Diagnosis not present

## 2019-12-12 DIAGNOSIS — Z01419 Encounter for gynecological examination (general) (routine) without abnormal findings: Secondary | ICD-10-CM | POA: Diagnosis not present

## 2019-12-12 DIAGNOSIS — R8290 Unspecified abnormal findings in urine: Secondary | ICD-10-CM | POA: Diagnosis not present

## 2019-12-12 DIAGNOSIS — Z124 Encounter for screening for malignant neoplasm of cervix: Secondary | ICD-10-CM | POA: Diagnosis not present

## 2019-12-21 DIAGNOSIS — E119 Type 2 diabetes mellitus without complications: Secondary | ICD-10-CM | POA: Diagnosis not present

## 2019-12-21 DIAGNOSIS — E890 Postprocedural hypothyroidism: Secondary | ICD-10-CM | POA: Diagnosis not present

## 2019-12-21 DIAGNOSIS — E892 Postprocedural hypoparathyroidism: Secondary | ICD-10-CM | POA: Diagnosis not present

## 2019-12-21 DIAGNOSIS — C73 Malignant neoplasm of thyroid gland: Secondary | ICD-10-CM | POA: Diagnosis not present

## 2020-01-10 ENCOUNTER — Other Ambulatory Visit: Payer: Self-pay | Admitting: Sports Medicine

## 2020-01-10 DIAGNOSIS — R635 Abnormal weight gain: Secondary | ICD-10-CM

## 2020-01-10 DIAGNOSIS — M5416 Radiculopathy, lumbar region: Secondary | ICD-10-CM | POA: Diagnosis not present

## 2020-01-10 DIAGNOSIS — G894 Chronic pain syndrome: Secondary | ICD-10-CM | POA: Diagnosis not present

## 2020-01-10 DIAGNOSIS — M961 Postlaminectomy syndrome, not elsewhere classified: Secondary | ICD-10-CM | POA: Diagnosis not present

## 2020-01-10 DIAGNOSIS — E6609 Other obesity due to excess calories: Secondary | ICD-10-CM

## 2020-01-31 ENCOUNTER — Telehealth: Payer: Self-pay | Admitting: Sports Medicine

## 2020-01-31 NOTE — Telephone Encounter (Signed)
Received fax for PA on Ozempic sent through cover my meds and received authorization.   CaseId: [id number]  Valid: 01/01/2020 - 01/30/2023 - CF

## 2020-02-01 ENCOUNTER — Other Ambulatory Visit: Payer: Self-pay | Admitting: Sports Medicine

## 2020-02-01 DIAGNOSIS — E119 Type 2 diabetes mellitus without complications: Secondary | ICD-10-CM

## 2020-02-01 MED ORDER — OZEMPIC (1 MG/DOSE) 2 MG/1.5ML ~~LOC~~ SOPN: 1.0000 mg | PEN_INJECTOR | SUBCUTANEOUS | 3 refills | Status: DC

## 2020-02-06 DIAGNOSIS — R1033 Periumbilical pain: Secondary | ICD-10-CM | POA: Diagnosis not present

## 2020-02-06 DIAGNOSIS — R1010 Upper abdominal pain, unspecified: Secondary | ICD-10-CM | POA: Diagnosis not present

## 2020-02-06 DIAGNOSIS — R110 Nausea: Secondary | ICD-10-CM | POA: Diagnosis not present

## 2020-02-06 DIAGNOSIS — R630 Anorexia: Secondary | ICD-10-CM | POA: Diagnosis not present

## 2020-02-08 DIAGNOSIS — R109 Unspecified abdominal pain: Secondary | ICD-10-CM | POA: Diagnosis not present

## 2020-02-08 DIAGNOSIS — R634 Abnormal weight loss: Secondary | ICD-10-CM | POA: Diagnosis not present

## 2020-02-13 ENCOUNTER — Other Ambulatory Visit: Payer: Self-pay | Admitting: Sports Medicine

## 2020-02-13 DIAGNOSIS — I10 Essential (primary) hypertension: Secondary | ICD-10-CM

## 2020-02-20 DIAGNOSIS — M961 Postlaminectomy syndrome, not elsewhere classified: Secondary | ICD-10-CM | POA: Diagnosis not present

## 2020-02-20 DIAGNOSIS — M5416 Radiculopathy, lumbar region: Secondary | ICD-10-CM | POA: Diagnosis not present

## 2020-02-26 ENCOUNTER — Other Ambulatory Visit: Payer: Self-pay

## 2020-02-26 ENCOUNTER — Encounter: Payer: Self-pay | Admitting: Sports Medicine

## 2020-02-26 ENCOUNTER — Ambulatory Visit (INDEPENDENT_AMBULATORY_CARE_PROVIDER_SITE_OTHER): Payer: BC Managed Care – PPO | Admitting: Sports Medicine

## 2020-02-26 DIAGNOSIS — M961 Postlaminectomy syndrome, not elsewhere classified: Secondary | ICD-10-CM

## 2020-02-26 NOTE — Assessment & Plan Note (Signed)
This pleasant 54 year old female has had lumbar surgery, persistent back pain, she is currently managed by pain management provider at [facility] with epidural and caudal injections. They are considering spinal cord stimulator, she will think about it. Today she wanted some forms filled out, we did this today, return to see me as needed for this.

## 2020-02-26 NOTE — Progress Notes (Signed)
    Procedures performed today:    None.  Independent interpretation of notes and tests performed by another provider:   None.  Brief History, Exam, Impression, and Recommendations:    Postlaminectomy syndrome This pleasant 54 year old female has had lumbar surgery, persistent back pain, she is currently managed by pain management provider at [facility] with epidural and caudal injections. They are considering spinal cord stimulator, she will think about it. Today she wanted some forms filled out, we did this today, return to see me as needed for this.  I spent 30 minutes of total time managing this patient today, this includes chart review, face to face, and non-face to face time.   ___________________________________________ Gwen Her. Dianah Field, M.D., ABFM., CAQSM. Primary Care and [place] Instructor of [place] of [facility] of Medicine

## 2020-03-04 DIAGNOSIS — K580 Irritable bowel syndrome with diarrhea: Secondary | ICD-10-CM | POA: Diagnosis not present

## 2020-03-04 DIAGNOSIS — Z86010 Personal history of colonic polyps: Secondary | ICD-10-CM | POA: Diagnosis not present

## 2020-03-04 DIAGNOSIS — K219 Gastro-esophageal reflux disease without esophagitis: Secondary | ICD-10-CM | POA: Diagnosis not present

## 2020-03-04 DIAGNOSIS — R110 Nausea: Secondary | ICD-10-CM | POA: Diagnosis not present

## 2020-03-12 DIAGNOSIS — D225 Melanocytic nevi of trunk: Secondary | ICD-10-CM | POA: Diagnosis not present

## 2020-03-12 DIAGNOSIS — L905 Scar conditions and fibrosis of skin: Secondary | ICD-10-CM | POA: Diagnosis not present

## 2020-03-12 DIAGNOSIS — L570 Actinic keratosis: Secondary | ICD-10-CM | POA: Diagnosis not present

## 2020-03-12 DIAGNOSIS — L821 Other seborrheic keratosis: Secondary | ICD-10-CM | POA: Diagnosis not present

## 2020-04-03 DIAGNOSIS — M961 Postlaminectomy syndrome, not elsewhere classified: Secondary | ICD-10-CM | POA: Diagnosis not present

## 2020-04-03 DIAGNOSIS — M5416 Radiculopathy, lumbar region: Secondary | ICD-10-CM | POA: Diagnosis not present

## 2020-04-03 DIAGNOSIS — G894 Chronic pain syndrome: Secondary | ICD-10-CM | POA: Diagnosis not present

## 2020-05-14 DIAGNOSIS — R898 Other abnormal findings in specimens from other organs, systems and tissues: Secondary | ICD-10-CM | POA: Diagnosis not present

## 2020-05-14 DIAGNOSIS — Z85850 Personal history of malignant neoplasm of thyroid: Secondary | ICD-10-CM | POA: Diagnosis not present

## 2020-05-14 DIAGNOSIS — Z85820 Personal history of malignant melanoma of skin: Secondary | ICD-10-CM | POA: Diagnosis not present

## 2020-05-14 DIAGNOSIS — Z803 Family history of malignant neoplasm of breast: Secondary | ICD-10-CM | POA: Diagnosis not present

## 2020-05-14 DIAGNOSIS — Z8041 Family history of malignant neoplasm of ovary: Secondary | ICD-10-CM | POA: Diagnosis not present

## 2020-05-23 DIAGNOSIS — M5126 Other intervertebral disc displacement, lumbar region: Secondary | ICD-10-CM | POA: Diagnosis not present

## 2020-05-23 DIAGNOSIS — M9904 Segmental and somatic dysfunction of sacral region: Secondary | ICD-10-CM | POA: Diagnosis not present

## 2020-05-23 DIAGNOSIS — M5416 Radiculopathy, lumbar region: Secondary | ICD-10-CM | POA: Diagnosis not present

## 2020-05-23 DIAGNOSIS — M9903 Segmental and somatic dysfunction of lumbar region: Secondary | ICD-10-CM | POA: Diagnosis not present

## 2020-05-30 DIAGNOSIS — E890 Postprocedural hypothyroidism: Secondary | ICD-10-CM | POA: Diagnosis not present

## 2020-05-30 DIAGNOSIS — Z79899 Other long term (current) drug therapy: Secondary | ICD-10-CM | POA: Diagnosis not present

## 2020-05-30 DIAGNOSIS — Z8639 Personal history of other endocrine, nutritional and metabolic disease: Secondary | ICD-10-CM | POA: Diagnosis not present

## 2020-05-30 DIAGNOSIS — E119 Type 2 diabetes mellitus without complications: Secondary | ICD-10-CM | POA: Diagnosis not present

## 2020-05-30 DIAGNOSIS — M961 Postlaminectomy syndrome, not elsewhere classified: Secondary | ICD-10-CM | POA: Diagnosis not present

## 2020-05-30 DIAGNOSIS — C73 Malignant neoplasm of thyroid gland: Secondary | ICD-10-CM | POA: Diagnosis not present

## 2020-05-30 DIAGNOSIS — M5416 Radiculopathy, lumbar region: Secondary | ICD-10-CM | POA: Diagnosis not present

## 2020-05-30 DIAGNOSIS — G894 Chronic pain syndrome: Secondary | ICD-10-CM | POA: Diagnosis not present

## 2020-06-04 DIAGNOSIS — M9904 Segmental and somatic dysfunction of sacral region: Secondary | ICD-10-CM | POA: Diagnosis not present

## 2020-06-04 DIAGNOSIS — M5126 Other intervertebral disc displacement, lumbar region: Secondary | ICD-10-CM | POA: Diagnosis not present

## 2020-06-04 DIAGNOSIS — M9903 Segmental and somatic dysfunction of lumbar region: Secondary | ICD-10-CM | POA: Diagnosis not present

## 2020-06-04 DIAGNOSIS — M5416 Radiculopathy, lumbar region: Secondary | ICD-10-CM | POA: Diagnosis not present

## 2020-06-11 DIAGNOSIS — Z803 Family history of malignant neoplasm of breast: Secondary | ICD-10-CM | POA: Diagnosis not present

## 2020-06-11 DIAGNOSIS — Z1371 Encounter for nonprocreative screening for genetic disease carrier status: Secondary | ICD-10-CM | POA: Diagnosis not present

## 2020-06-13 ENCOUNTER — Other Ambulatory Visit: Payer: Self-pay | Admitting: Chiropractic Medicine

## 2020-06-13 DIAGNOSIS — G8929 Other chronic pain: Secondary | ICD-10-CM

## 2020-06-13 DIAGNOSIS — M25551 Pain in right hip: Secondary | ICD-10-CM

## 2020-06-14 DIAGNOSIS — Z79899 Other long term (current) drug therapy: Secondary | ICD-10-CM | POA: Diagnosis not present

## 2020-06-18 ENCOUNTER — Other Ambulatory Visit: Payer: Self-pay | Admitting: Chiropractic Medicine

## 2020-06-18 DIAGNOSIS — M5126 Other intervertebral disc displacement, lumbar region: Secondary | ICD-10-CM | POA: Diagnosis not present

## 2020-06-18 DIAGNOSIS — M9903 Segmental and somatic dysfunction of lumbar region: Secondary | ICD-10-CM | POA: Diagnosis not present

## 2020-06-18 DIAGNOSIS — M5416 Radiculopathy, lumbar region: Secondary | ICD-10-CM | POA: Diagnosis not present

## 2020-06-18 DIAGNOSIS — M9904 Segmental and somatic dysfunction of sacral region: Secondary | ICD-10-CM | POA: Diagnosis not present

## 2020-06-21 ENCOUNTER — Other Ambulatory Visit: Payer: Self-pay

## 2020-06-21 ENCOUNTER — Ambulatory Visit
Admission: RE | Admit: 2020-06-21 | Discharge: 2020-06-21 | Disposition: A | Discharge status: Home / Self Care | Payer: BC Managed Care – PPO | Source: Ambulatory Visit | Attending: Chiropractic Medicine | Admitting: Chiropractic Medicine

## 2020-06-21 DIAGNOSIS — G8929 Other chronic pain: Secondary | ICD-10-CM

## 2020-06-21 DIAGNOSIS — M25551 Pain in right hip: Secondary | ICD-10-CM | POA: Diagnosis not present

## 2020-06-21 DIAGNOSIS — S73191A Other sprain of right hip, initial encounter: Secondary | ICD-10-CM | POA: Diagnosis not present

## 2020-06-21 MED ORDER — IOPAMIDOL (ISOVUE-M 200) INJECTION 41%
13.0000 mL | Freq: Once | INTRAMUSCULAR | Status: AC
  Administered 2020-06-21: 13 mL via INTRA_ARTICULAR

## 2020-06-24 DIAGNOSIS — M9903 Segmental and somatic dysfunction of lumbar region: Secondary | ICD-10-CM | POA: Diagnosis not present

## 2020-06-24 DIAGNOSIS — M9904 Segmental and somatic dysfunction of sacral region: Secondary | ICD-10-CM | POA: Diagnosis not present

## 2020-06-24 DIAGNOSIS — M5416 Radiculopathy, lumbar region: Secondary | ICD-10-CM | POA: Diagnosis not present

## 2020-06-24 DIAGNOSIS — M5126 Other intervertebral disc displacement, lumbar region: Secondary | ICD-10-CM | POA: Diagnosis not present

## 2020-07-22 ENCOUNTER — Ambulatory Visit (INDEPENDENT_AMBULATORY_CARE_PROVIDER_SITE_OTHER): Payer: BC Managed Care – PPO

## 2020-07-22 ENCOUNTER — Other Ambulatory Visit: Payer: Self-pay

## 2020-07-22 ENCOUNTER — Ambulatory Visit (INDEPENDENT_AMBULATORY_CARE_PROVIDER_SITE_OTHER): Payer: BC Managed Care – PPO | Admitting: Physician Assistant

## 2020-07-22 ENCOUNTER — Encounter: Payer: Self-pay | Admitting: Physician Assistant

## 2020-07-22 VITALS — BP 116/73 | HR 79 | Ht 66.0 in | Wt 130.0 lb

## 2020-07-22 DIAGNOSIS — T50Z95A Adverse effect of other vaccines and biological substances, initial encounter: Secondary | ICD-10-CM

## 2020-07-22 DIAGNOSIS — R0789 Other chest pain: Secondary | ICD-10-CM

## 2020-07-22 DIAGNOSIS — R0602 Shortness of breath: Secondary | ICD-10-CM | POA: Diagnosis not present

## 2020-07-22 DIAGNOSIS — R079 Chest pain, unspecified: Secondary | ICD-10-CM

## 2020-07-22 DIAGNOSIS — I10 Essential (primary) hypertension: Secondary | ICD-10-CM | POA: Diagnosis not present

## 2020-07-22 MED ORDER — KETOROLAC TROMETHAMINE 60 MG/2ML IM SOLN
60.0000 mg | Freq: Once | INTRAMUSCULAR | Status: AC
  Administered 2020-07-22: 60 mg via INTRAMUSCULAR

## 2020-07-22 NOTE — Patient Instructions (Addendum)
Get CXR and labs.     Costochondritis Costochondritis is swelling and irritation (inflammation) of the tissue (cartilage) that connects your ribs to your breastbone (sternum). This causes pain in the front of your chest. Usually, the pain:  Starts gradually.  Is in more than one rib. This condition usually goes away on its own over time. Follow these instructions at home:  Do not do anything that makes your pain worse.  If directed, put ice on the painful area: ? Put ice in a plastic bag. ? Place a towel between your skin and the bag. ? Leave the ice on for 20 minutes, 2-3 times a day.  If directed, put heat on the affected area as often as told by your doctor. Use the heat source that your doctor tells you to use, such as a moist heat pack or a heating pad. ? Place a towel between your skin and the heat source. ? Leave the heat on for 20-30 minutes. ? Take off the heat if your skin turns bright red. This is very important if you cannot feel pain, heat, or cold. You may have a greater risk of getting burned.  Take over-the-counter and prescription medicines only as told by your doctor.  Return to your normal activities as told by your doctor. Ask your doctor what activities are safe for you.  Keep all follow-up visits as told by your doctor. This is important. Contact a doctor if:  You have chills or a fever.  Your pain does not go away or it gets worse.  You have a cough that does not go away. Get help right away if:  You are short of breath. This information is not intended to replace advice given to you by your health care provider. Make sure you discuss any questions you have with your health care provider. Document Revised: 11/10/2017 Document Reviewed: 02/19/2016 Elsevier Patient Education  2020 [employer].

## 2020-07-22 NOTE — Progress Notes (Signed)
Subjective:    Patient ID: Nancy Poole, female    DOB: 1965-11-21, 54 y.o.   MRN: [id number]  HPI  Pt is a 54 yo female with HTN, hypothyroidism, T2DM, GERD who presents to the clinic with left sided chest pain, chest tightness and SOB since a day after her covid vaccine on 07/17/2020 with [employer]. iniatily she only noticed some left arm pain at injection side but it has since traveled into her left chest. She has some pain with breathing. She is fatigued and does not want to do anything. Her grandson came over to play this weekend and she had no energy. No leg swelling or pain. No cough, fever, chills, loss of smell or taste, GI symptoms. She has not taken anything to make better. She does not take NSAIDs due to her ulcer hx. She is on a PPI daily.   .. Active Ambulatory Problems    Diagnosis Date Noted  . Bilateral plantar fasciitis 10/03/2013  . GERD (gastroesophageal reflux disease) 10/23/2013  . Annual physical exam 10/23/2013  . Postlaminectomy syndrome 11/13/2013  . Hypertension 12/20/2013  . Insomnia 12/20/2013  . Abnormal weight gain 01/03/2014  . Diabetes mellitus, type 2 ([place]) 03/07/2014  . Hypothyroidism 07/24/2014  . HLD (hyperlipidemia) 09/11/2013  . Incomplete bladder emptying 07/31/2014  . Primary papillary carcinoma of thyroid, follicular subtype, post thyroidectomy 03/17/2010  . Otoporosis 08/12/2010  . Lung nodule, solitary 08/24/2012  . Eustachian tube dysfunction 04/28/2016  . Ovarian cyst 07/21/2017  . Impingement syndrome of both shoulders 01/04/2018  . Right foot pain 04/11/2018  . Travel advice encounter 07/25/2018  . History of cholecystectomy 09/05/2018  . Primary osteoarthritis of left first IP joint 11/08/2018  . Toe swelling 12/01/2018   Resolved Ambulatory Problems    Diagnosis Date Noted  . Cerumen impaction 01/29/2014  . Dysuria 03/02/2014  . Microcytic anemia 03/07/2014  . CD (conductive deafness) 01/20/2011  . Blood in the urine 07/31/2014  .  Plantar fasciitis 02/10/2013  . Thoracic and lumbosacral neuritis 12/21/2012  . Dysfunction of left eustachian tube 12/04/2014  . Dysfunction of eustachian tube 12/04/2014  . Calcium deficiency disease 01/29/2015  . Hypoparathyroidism ([place]) 01/29/2015  . Nerve root inflammation 11/13/2013  . Skin lesion 03/24/2016  . Skin tag 03/31/2016  . Chest trauma, penetrating, right, initial encounter 12/16/2016   Past Medical History:  Diagnosis Date  . Cancer ([place])   . Diabetes mellitus without complication ([place])   . Herniated disc         Review of Systems  Constitutional: Positive for fatigue. Negative for chills, diaphoresis and fever.  HENT: Negative for congestion, rhinorrhea, sinus pressure and sinus pain.   Respiratory: Positive for chest tightness and shortness of breath. Negative for wheezing.   Cardiovascular: Positive for chest pain. Negative for palpitations and leg swelling.  Gastrointestinal: Negative for abdominal distention, abdominal pain, constipation and diarrhea.  Genitourinary: Negative for dysuria.  Skin: Negative for pallor and rash.  Neurological: Negative for dizziness and headaches.  Hematological: Does not bruise/bleed easily.      Objective:   Physical Exam Vitals reviewed.  Constitutional:      Appearance: She is well-developed. She is not ill-appearing or diaphoretic.  HENT:     Head: Normocephalic.  Cardiovascular:     Rate and Rhythm: Normal rate and regular rhythm.     Heart sounds: Normal heart sounds. No murmur heard.   Pulmonary:     Effort: Pulmonary effort is normal.     Breath sounds:  Normal breath sounds. No wheezing or rhonchi.     Comments: Tenderness to palpation over left lateral pectoralis muscles.    Chest:     Chest wall: Tenderness present.  Abdominal:     General: There is no distension.     Palpations: Abdomen is soft.     Tenderness: There is no abdominal tenderness.  Musculoskeletal:       Legs:  Neurological:      General: No focal deficit present.     Mental Status: She is alert and oriented to person, place, and time.  Psychiatric:        Mood and Affect: Mood normal.           Assessment & Plan:  Marland KitchenMarland KitchenCecille Poole was seen today for abdominal pain.  Diagnoses and all orders for this visit:  Adverse effect of vaccine, initial encounter -     CK total and CKMB (cardiac)not at [facility] -     Troponin I -     CBC with Differential/Platelet -     Sed Rate (ESR) -     C-reactive protein -     COMPLETE METABOLIC PANEL WITH GFR -     DG Chest 2 View  Left-sided chest pain -     CK total and CKMB (cardiac)not at [facility] -     Troponin I -     CBC with Differential/Platelet -     Sed Rate (ESR) -     C-reactive protein -     COMPLETE METABOLIC PANEL WITH GFR -     EKG 12-Lead -     DG Chest 2 View -     ketorolac (TORADOL) injection 60 mg  Sensation of chest tightness -     CK total and CKMB (cardiac)not at [facility] -     Troponin I -     CBC with Differential/Platelet -     Sed Rate (ESR) -     C-reactive protein -     COMPLETE METABOLIC PANEL WITH GFR -     EKG 12-Lead -     DG Chest 2 View   Concern for vaccine reaction. Evaluating for myocarditis vs pericarditis. Wells criteria is 0 other than increased risk after vaccine. Normal HR/no leg swelling or pain/pulse ox is 98 percent/no previous DVT/not on any hormones/post menopausal.   EKG- no acute changes. No ST depression or elevation. No arrhthymias.  Ordered CXR. Will get labs to look for any signs of inflammation. Reassured patient vitals look GREAT.  toradol shot given to help with inflammation from more of a vaccine immune response not reaction. Ok to use tylenol for aches and pains. Consider ice packs on chest.   Will continue to follow closely. If not improving or worsening please let us know. Will call with lab results in morning.

## 2020-07-23 ENCOUNTER — Encounter: Payer: Self-pay | Admitting: Physician Assistant

## 2020-07-23 LAB — SEDIMENTATION RATE: Sed Rate: 9 mm/h (ref 0–30)

## 2020-07-23 LAB — COMPLETE METABOLIC PANEL WITH GFR
AG Ratio: 1.9 (calc) (ref 1.0–2.5)
ALT: 24 U/L (ref 6–29)
AST: 25 U/L (ref 10–35)
Albumin: 4.2 g/dL (ref 3.6–5.1)
Alkaline phosphatase (APISO): 77 U/L (ref 37–153)
BUN: 14 mg/dL (ref 7–25)
CO2: 24 mmol/L (ref 20–32)
Calcium: 9.1 mg/dL (ref 8.6–10.4)
Chloride: 110 mmol/L (ref 98–110)
Creat: 0.97 mg/dL (ref 0.50–1.05)
GFR, Est African American: 77 mL/min/{1.73_m2} (ref >=60)
GFR, Est Non African American: 66 mL/min/{1.73_m2} (ref >=60)
Globulin: 2.2 g/dL (calc) (ref 1.9–3.7)
Glucose, Bld: 89 mg/dL (ref 65–139)
Potassium: 3.7 mmol/L (ref 3.5–5.3)
Sodium: 141 mmol/L (ref 135–146)
Total Bilirubin: 0.4 mg/dL (ref 0.2–1.2)
Total Protein: 6.4 g/dL (ref 6.1–8.1)

## 2020-07-23 LAB — CBC WITH DIFFERENTIAL/PLATELET
Absolute Monocytes: 330 cells/uL (ref 200–950)
Basophils Absolute: 39 cells/uL (ref 0–200)
Basophils Relative: 0.7 %
Eosinophils Absolute: 73 cells/uL (ref 15–500)
Eosinophils Relative: 1.3 %
HCT: 35.8 % (ref 35.0–45.0)
Hemoglobin: 11.5 g/dL — ABNORMAL LOW (ref 11.7–15.5)
Lymphs Abs: 1809 cells/uL (ref 850–3900)
MCH: 28.6 pg (ref 27.0–33.0)
MCHC: 32.1 g/dL (ref 32.0–36.0)
MCV: 89.1 fL (ref 80.0–100.0)
MPV: 10.5 fL (ref 7.5–12.5)
Monocytes Relative: 5.9 %
Neutro Abs: 3349 cells/uL (ref 1500–7800)
Neutrophils Relative %: 59.8 %
Platelets: 203 10*3/uL (ref 140–400)
RBC: 4.02 10*6/uL (ref 3.80–5.10)
RDW: 12.7 % (ref 11.0–15.0)
Total Lymphocyte: 32.3 %
WBC: 5.6 10*3/uL (ref 3.8–10.8)

## 2020-07-23 LAB — CK TOTAL AND CKMB (NOT AT ARMC)
CK, MB: <0.7 ng/mL (ref 0–5.0)
Total CK: 85 U/L (ref 29–143)

## 2020-07-23 LAB — D-DIMER, QUANTITATIVE

## 2020-07-23 LAB — TROPONIN I: Troponin I: <3 ng/L (ref <=47)

## 2020-07-23 LAB — C-REACTIVE PROTEIN: CRP: 1.3 mg/L (ref <8.0)

## 2020-07-23 NOTE — Progress Notes (Signed)
No acute findings with CXR.

## 2020-07-23 NOTE — Progress Notes (Signed)
Nancy Poole,   Your cardiac enzymes are all normal as well. Which is reassuring. How is your chest pain this morning the same, better, worse? Any new symptoms?   [name]-could you call the lab and just see if they could add a d-dimer to this blood work?

## 2020-07-23 NOTE — Progress Notes (Signed)
Lori,   CRP and ESR inflammatory markers are low.  Your are a tad anemic but have a history of this and unchanged in the at 2 years.  No WBC or platelet elevation.  Kidney, liver, glucose look great.   So far labs look great. Still waiting on cardiac enzymes and CXR to result.   How are you feeling this morning?

## 2020-07-30 DIAGNOSIS — M25551 Pain in right hip: Secondary | ICD-10-CM | POA: Diagnosis not present

## 2020-07-30 DIAGNOSIS — M1611 Unilateral primary osteoarthritis, right hip: Secondary | ICD-10-CM | POA: Diagnosis not present

## 2020-08-07 DIAGNOSIS — M25551 Pain in right hip: Secondary | ICD-10-CM | POA: Diagnosis not present

## 2020-08-28 DIAGNOSIS — K219 Gastro-esophageal reflux disease without esophagitis: Secondary | ICD-10-CM | POA: Diagnosis not present

## 2020-08-28 DIAGNOSIS — E119 Type 2 diabetes mellitus without complications: Secondary | ICD-10-CM | POA: Diagnosis not present

## 2020-08-28 DIAGNOSIS — R1031 Right lower quadrant pain: Secondary | ICD-10-CM | POA: Diagnosis not present

## 2020-08-28 DIAGNOSIS — R140 Abdominal distension (gaseous): Secondary | ICD-10-CM | POA: Diagnosis not present

## 2020-08-28 DIAGNOSIS — K582 Mixed irritable bowel syndrome: Secondary | ICD-10-CM | POA: Diagnosis not present

## 2020-08-28 LAB — HM DIABETES EYE EXAM

## 2020-09-11 ENCOUNTER — Ambulatory Visit (INDEPENDENT_AMBULATORY_CARE_PROVIDER_SITE_OTHER): Payer: BC Managed Care – PPO

## 2020-09-11 ENCOUNTER — Other Ambulatory Visit: Payer: Self-pay

## 2020-09-11 ENCOUNTER — Encounter: Payer: Self-pay | Admitting: Sports Medicine

## 2020-09-11 ENCOUNTER — Ambulatory Visit (INDEPENDENT_AMBULATORY_CARE_PROVIDER_SITE_OTHER): Payer: BC Managed Care – PPO | Admitting: Sports Medicine

## 2020-09-11 DIAGNOSIS — R531 Weakness: Secondary | ICD-10-CM | POA: Diagnosis not present

## 2020-09-11 DIAGNOSIS — M5030 Other cervical disc degeneration, unspecified cervical region: Secondary | ICD-10-CM

## 2020-09-11 DIAGNOSIS — M4312 Spondylolisthesis, cervical region: Secondary | ICD-10-CM | POA: Diagnosis not present

## 2020-09-11 DIAGNOSIS — M19012 Primary osteoarthritis, left shoulder: Secondary | ICD-10-CM | POA: Diagnosis not present

## 2020-09-11 DIAGNOSIS — M7541 Impingement syndrome of right shoulder: Secondary | ICD-10-CM | POA: Diagnosis not present

## 2020-09-11 DIAGNOSIS — E034 Atrophy of thyroid (acquired): Secondary | ICD-10-CM

## 2020-09-11 DIAGNOSIS — E119 Type 2 diabetes mellitus without complications: Secondary | ICD-10-CM

## 2020-09-11 DIAGNOSIS — R079 Chest pain, unspecified: Secondary | ICD-10-CM

## 2020-09-11 DIAGNOSIS — M85812 Other specified disorders of bone density and structure, left shoulder: Secondary | ICD-10-CM | POA: Diagnosis not present

## 2020-09-11 DIAGNOSIS — M7542 Impingement syndrome of left shoulder: Secondary | ICD-10-CM

## 2020-09-11 DIAGNOSIS — M47812 Spondylosis without myelopathy or radiculopathy, cervical region: Secondary | ICD-10-CM | POA: Diagnosis not present

## 2020-09-11 NOTE — Assessment & Plan Note (Signed)
Historical impingement syndrome both shoulders, her left upper chest pain could certainly be referred from her shoulder, I would like updated shoulder x-rays, neck x-rays, rotator cuff rehabilitation exercises given, return to see me in 6 weeks.

## 2020-09-11 NOTE — Progress Notes (Signed)
    Procedures performed today:    None.  Independent interpretation of notes and tests performed by another provider:   None.  Brief History, Exam, Impression, and Recommendations:    Chest pain Unclear etiology, present on and off for several months. She did see Luvenia Starch and had an appropriate work-up including ECG, cardiac enzymes, chest x-ray, all of the above are unremarkable. It sounds as though she did have a D-dimer drawn but this was not run, we will recheck the D-dimer today. On further questioning she does get occasional chest pain with deep breathing, as well as going up a couple flights of stairs. I am going to pull the trigger for nuclear stress test. Exam is completely unremarkable. Decision made not to hospitalize.   Impingement syndrome of both shoulders Historical impingement syndrome both shoulders, her left upper chest pain could certainly be referred from her shoulder, I would like updated shoulder x-rays, neck x-rays, rotator cuff rehabilitation exercises given, return to see me in 6 weeks.    ___________________________________________ Gwen Her. Dianah Field, M.D., ABFM., CAQSM. Primary Care and [place] Instructor of [place] of [facility] of Medicine

## 2020-09-11 NOTE — Assessment & Plan Note (Addendum)
Unclear etiology, present on and off for several months. She did see Luvenia Starch and had an appropriate work-up including ECG, cardiac enzymes, chest x-ray, all of the above are unremarkable. It sounds as though she did have a D-dimer drawn but this was not run, we will recheck the D-dimer today. On further questioning she does get occasional chest pain with deep breathing, as well as going up a couple flights of stairs. I am going to pull the trigger for nuclear stress test. Exam is completely unremarkable. Decision made not to hospitalize.

## 2020-09-13 ENCOUNTER — Other Ambulatory Visit: Payer: Self-pay

## 2020-09-13 DIAGNOSIS — R079 Chest pain, unspecified: Secondary | ICD-10-CM

## 2020-09-18 ENCOUNTER — Telehealth (HOSPITAL_COMMUNITY): Payer: Self-pay | Admitting: *Deleted

## 2020-09-18 NOTE — Telephone Encounter (Signed)
Close encounter 

## 2020-09-19 ENCOUNTER — Other Ambulatory Visit: Payer: Self-pay

## 2020-09-19 ENCOUNTER — Ambulatory Visit (HOSPITAL_COMMUNITY)
Admission: RE | Admit: 2020-09-19 | Discharge: 2020-09-19 | Disposition: A | Discharge status: Home / Self Care | Payer: BC Managed Care – PPO | Source: Ambulatory Visit | Attending: Cardiology | Admitting: Cardiology

## 2020-09-19 DIAGNOSIS — R079 Chest pain, unspecified: Secondary | ICD-10-CM

## 2020-09-19 LAB — MYOCARDIAL PERFUSION IMAGING
LV dias vol: 93 mL (ref 46–106)
LV sys vol: 39 mL
Peak HR: 105 {beats}/min
Rest HR: 57 {beats}/min
SDS: 0
SRS: 0
SSS: 0
TID: 1.04

## 2020-09-19 MED ORDER — REGADENOSON 0.4 MG/5ML IV SOLN
0.4000 mg | Freq: Once | INTRAVENOUS | Status: AC
  Administered 2020-09-19: 0.4 mg via INTRAVENOUS

## 2020-09-19 MED ORDER — TECHNETIUM TC 99M TETROFOSMIN IV KIT
10.7000 | PACK | Freq: Once | INTRAVENOUS | Status: AC | PRN
  Administered 2020-09-19: 10.7 via INTRAVENOUS
  Filled 2020-09-19: qty 11

## 2020-09-19 MED ORDER — TECHNETIUM TC 99M TETROFOSMIN IV KIT
29.9000 | PACK | Freq: Once | INTRAVENOUS | Status: AC | PRN
  Administered 2020-09-19: 29.9 via INTRAVENOUS
  Filled 2020-09-19: qty 30

## 2020-09-24 DIAGNOSIS — M25551 Pain in right hip: Secondary | ICD-10-CM | POA: Diagnosis not present

## 2020-11-25 ENCOUNTER — Encounter: Payer: Self-pay | Admitting: Sports Medicine

## 2020-12-02 ENCOUNTER — Encounter: Payer: Self-pay | Admitting: Sports Medicine

## 2020-12-02 ENCOUNTER — Ambulatory Visit: Payer: No Typology Code available for payment source | Admitting: Sports Medicine

## 2020-12-02 ENCOUNTER — Other Ambulatory Visit: Payer: Self-pay

## 2020-12-02 DIAGNOSIS — M7541 Impingement syndrome of right shoulder: Secondary | ICD-10-CM

## 2020-12-02 DIAGNOSIS — R635 Abnormal weight gain: Secondary | ICD-10-CM | POA: Diagnosis not present

## 2020-12-02 DIAGNOSIS — E6609 Other obesity due to excess calories: Secondary | ICD-10-CM

## 2020-12-02 DIAGNOSIS — M7542 Impingement syndrome of left shoulder: Secondary | ICD-10-CM

## 2020-12-02 DIAGNOSIS — E119 Type 2 diabetes mellitus without complications: Secondary | ICD-10-CM

## 2020-12-02 DIAGNOSIS — F458 Other somatoform disorders: Secondary | ICD-10-CM | POA: Diagnosis not present

## 2020-12-02 DIAGNOSIS — S73191S Other sprain of right hip, sequela: Secondary | ICD-10-CM

## 2020-12-02 DIAGNOSIS — S73191A Other sprain of right hip, initial encounter: Secondary | ICD-10-CM | POA: Insufficient documentation

## 2020-12-02 MED ORDER — TRAZODONE HCL 100 MG PO TABS: 100.0000 mg | ORAL_TABLET | Freq: Every day | ORAL | 3 refills | Status: DC

## 2020-12-02 MED ORDER — OMEPRAZOLE 40 MG PO CPDR: 40.0000 mg | DELAYED_RELEASE_CAPSULE | Freq: Every day | ORAL | 3 refills | Status: DC

## 2020-12-02 MED ORDER — OZEMPIC (1 MG/DOSE) 2 MG/1.5ML ~~LOC~~ SOPN: 1.0000 mg | PEN_INJECTOR | SUBCUTANEOUS | 3 refills | Status: DC

## 2020-12-02 MED ORDER — TOPIRAMATE 100 MG PO TABS: 100.0000 mg | ORAL_TABLET | Freq: Two times a day (BID) | ORAL | 3 refills | Status: DC

## 2020-12-02 NOTE — Assessment & Plan Note (Signed)
Recurrent left shoulder impingement syndrome, added home rehab exercises, Thera-Band, return to see me in 6 weeks, subacromial injection if no better.

## 2020-12-02 NOTE — Progress Notes (Signed)
    Procedures performed today:    None.  Independent interpretation of notes and tests performed by another provider:   None.  Brief History, Exam, Impression, and Recommendations:    Diabetes mellitus, type 2 Checking routine labs.  Bruxism Currently on trazodone prescribed by ENT, this seems to help, we can take over prescriptions as her ENT did retire.  Impingement syndrome of both shoulders Recurrent left shoulder impingement syndrome, added home rehab exercises, Thera-Band, return to see me in 6 weeks, subacromial injection if no better.  Labral tear of right hip joint No labral tear on MRI, she has been seeing Dr. Aretha Parrot with the [facility]. Steroid injection was helpful, this was done about 4 months ago, I am happy to do another ultrasound guided hip joint injection in the future if needed, we can also perform leukocyte rich and leukocyte poor PRP injections into the femoroacetabular joint with ultrasound guidance here, information given, return to see me as needed for the above procedures.    ___________________________________________ Gwen Her. Dianah Field, M.D., ABFM., CAQSM. Primary Care and [place] Instructor of [employer] [facility] [employer]

## 2020-12-02 NOTE — Assessment & Plan Note (Signed)
No labral tear on MRI, she has been seeing Dr. Aretha Parrot with the [facility] hip center. Steroid injection was helpful, this was done about 4 months ago, I am happy to do another ultrasound guided hip joint injection in the future if needed, we can also perform leukocyte rich and leukocyte poor PRP injections into the femoroacetabular joint with ultrasound guidance here, information given, return to see me as needed for the above procedures.

## 2020-12-02 NOTE — Assessment & Plan Note (Signed)
Checking routine labs 

## 2020-12-02 NOTE — Patient Instructions (Signed)
? ?Platelet-rich plasma is used in musculoskeletal medicine to focus your own body?s ability to ?heal. It has several well-done published randomized control trials (RCT) which demonstrate both ?its effectiveness and safety in many musculoskeletal conditions, including osteoarthritis, ?tendinopathies, and damaged vertebral discs. PRP has been in clinical use since the 1990?s. ?Many people know that platelets form a clot if there is a cut in the skin. It turns out that ?platelets do not only form a clot, they also start the body?s own repair process. When platelets ?activate to form a clot, they also release alpha granules which have hundreds of chemical ?messengers in them that initiate and organize repair to the damaged tissue. Precisely placing ?PRP into the site of injury will initiate the healing process by activating on the damaged ?cartilage or tendon. This is an inflammatory process, and inflammation is the vital first phase of ?Healing. ? ?What to expect and how to prepare for PRP ? ? 2 weeks prior to the procedure: depending on the procedure, you may need to arrange ?for a driver to bring you home. IF you are having a lower extremity procedure, we can provide crutches ?as needed. ? ? 7 days prior to the procedure: Stop taking anti-inflammatory drugs like ibuprofen, ?Naprosyn, Celebrex, or Meloxicam. Let Dr. Aurianna Earlywine know if you have been taking prednisone or other ?corticosteroids in the last month. ? ? The day before the procedure: thoroughly shower and clean your skin.  ? ? The day of the procedure: Wear loose-fitting clothing like sweatpants or shorts. If you ?are having an upper body procedure wear a top that can button or zip up. ? ?PRP will initiate healing and a productive inflammation, and PRP therapy will make the body ?part treated sore for 4 days to two weeks. Anti-inflammatory drugs (i.e. ibuprofen, Naprosyn, ?Celebrex) and corticosteroids such as prednisone can blunt or stop this process,  so it is ?important to not take any anti-inflammatory drugs for 7 days before getting PRP therapy, or for ?at least three weeks after PRP therapy. Corticosteroid injections can blunt inflammation for 30 ?days, so let us know if you have had one recently. Depending on the body part injected, you ?may be in a sling or on crutches for several days. Just like wringing out a wet dishcloth, if you ?load or tense a tendon or ligament that has just been injected with PRP, some of the PRP ?injected will squish out. By keeping the body part treated relaxed by using a sling (for the ?shoulder or arm) or crutches (for hips and legs) for a few days, the PRP can bind in place and do ?its job.  ? ?You may need a driver to bring you home.  ?Tobacco/nicotine is a potent toxin and its use constricts small blood vessels which are needed for tissue repair.  ?Tobacco/nicotine use will limit the effectiveness of any treatment and stopping tobacco use is one of the single  ?greatest actions you can take to improve your health. Avoid toxins like alcohol, which inhibits and depresses the ?cells needed for tissue repair. ? ?What happens during the PRP procedure? ? ?Platelet rich plasma is made by taking some of your blood and performing a two-stage ?centrifuge process on it to concentrate the PRP. First, your blood is drawn into a syringe with a ?small amount of anti-coagulant in it (this is to keep the blood from clotting during this process). ?The amount of blood drawn is usually about 10-30 milliliters, depending on how much PRP is needed for   the treatment.  ?(There are 355 milliliters in a 12-ounce soda can for comparison).  ?Then the blood is transferred in a sterile fashion into a ?centrifuge tube. It is then centrifuged for the first cycle where the red blood cells are isolated ?and discarded. In the second centrifuge cycle, the platelet-rich fraction of the remaining plasma ?is concentrated and placed in a syringe. The skin at the  injection site is numbed with a small ?amount of topical cooling spray. Dr Jevonte Clanton will then precisely inject the PRP ?into the injury site using ultrasound guidance. ? ?What to do after your procedure ? ?I will give you specific medicine to control any discomfort you may have after the procedure. ?Avoid NSAIDs like ibuprofen. ?Acetaminophen can be used for mild pain.  ?Depending on the part of the body ?treated, usually you will be placed in a sling or on crutches for 1 to 3 days. Do your best not to ?tense or load the treated area during this time. After 3 days, unless otherwise instructed, the ?treated body part should be used and slowly moved through its full range of motion. It will be ?sore, but you will not be doing damage by moving it, in fact it needs to move to heal. If you ?were on crutches for a period of time, walking is ok once you are off the crutches. For now, ?avoid activities that specifically hurt you before being treated. Exercise is vital to good health ?and finding a way to cross train around your injury is important not only for your physical ?health, but for your mental health as well. Ask me about cross training options for your injury. ?Some brief (10 minutes or less) period of heat or ice therapy will not hurt the therapy, but it is ?not required. Usually, depending on the initial injury, physical therapy is started from two ?weeks to four weeks after injection. Improvements in pain and function should be expected ?from 8 weeks to 12 weeks after injection and some injuries may require more than one ?treatment.  ? ? ?___________________________________________ ?Prithvi Kooi J. Trenika Hudson, M.D., ABFM., CAQSM. ?Primary Care and Sports Medicine ?[facility] ? ?Adjunct Professor of Family Medicine  ?[employer] ? ?

## 2020-12-02 NOTE — Assessment & Plan Note (Signed)
Currently on trazodone prescribed by ENT, this seems to help, we can take over prescriptions as her ENT did retire.

## 2020-12-03 LAB — CBC
HCT: 35.5 % (ref 35.0–45.0)
Hemoglobin: 11.8 g/dL (ref 11.7–15.5)
MCH: 29.6 pg (ref 27.0–33.0)
MCHC: 33.2 g/dL (ref 32.0–36.0)
MCV: 89 fL (ref 80.0–100.0)
MPV: 10.4 fL (ref 7.5–12.5)
Platelets: 201 10*3/uL (ref 140–400)
RBC: 3.99 10*6/uL (ref 3.80–5.10)
RDW: 12.9 % (ref 11.0–15.0)
WBC: 4.9 10*3/uL (ref 3.8–10.8)

## 2020-12-03 LAB — COMPREHENSIVE METABOLIC PANEL
AG Ratio: 1.9 (calc) (ref 1.0–2.5)
ALT: 19 U/L (ref 6–29)
AST: 23 U/L (ref 10–35)
Albumin: 4.3 g/dL (ref 3.6–5.1)
Alkaline phosphatase (APISO): 75 U/L (ref 37–153)
BUN: 16 mg/dL (ref 7–25)
CO2: 26 mmol/L (ref 20–32)
Calcium: 9 mg/dL (ref 8.6–10.4)
Chloride: 110 mmol/L (ref 98–110)
Creat: 0.98 mg/dL (ref 0.50–1.05)
Globulin: 2.3 g/dL (calc) (ref 1.9–3.7)
Glucose, Bld: 87 mg/dL (ref 65–99)
Potassium: 3.7 mmol/L (ref 3.5–5.3)
Sodium: 142 mmol/L (ref 135–146)
Total Bilirubin: 0.4 mg/dL (ref 0.2–1.2)
Total Protein: 6.6 g/dL (ref 6.1–8.1)

## 2020-12-03 LAB — LIPID PANEL
Cholesterol: 194 mg/dL (ref <200)
HDL: 79 mg/dL (ref >=50)
LDL Cholesterol (Calc): 99 mg/dL (calc)
Non-HDL Cholesterol (Calc): 115 mg/dL (calc) (ref <130)
Total CHOL/HDL Ratio: 2.5 (calc) (ref <5.0)
Triglycerides: 69 mg/dL (ref <150)

## 2020-12-03 LAB — HEMOGLOBIN A1C
Hgb A1c MFr Bld: 5.1 % of total Hgb (ref <5.7)
Mean Plasma Glucose: 100 mg/dL
eAG (mmol/L): 5.5 mmol/L

## 2020-12-03 LAB — TSH: TSH: 0.39 mIU/L — ABNORMAL LOW

## 2020-12-31 LAB — HM MAMMOGRAPHY

## 2021-01-16 ENCOUNTER — Encounter: Payer: Self-pay | Admitting: Sports Medicine

## 2021-04-17 ENCOUNTER — Encounter: Payer: Self-pay | Admitting: Sports Medicine

## 2021-04-17 ENCOUNTER — Ambulatory Visit: Payer: No Typology Code available for payment source | Admitting: Sports Medicine

## 2021-04-17 ENCOUNTER — Ambulatory Visit: Payer: Self-pay

## 2021-04-17 ENCOUNTER — Other Ambulatory Visit: Payer: Self-pay

## 2021-04-17 VITALS — BP 102/64 | Ht 67.0 in | Wt 130.0 lb

## 2021-04-17 DIAGNOSIS — M25512 Pain in left shoulder: Secondary | ICD-10-CM

## 2021-04-17 DIAGNOSIS — G8929 Other chronic pain: Secondary | ICD-10-CM | POA: Diagnosis not present

## 2021-04-17 MED ORDER — METHYLPREDNISOLONE ACETATE 40 MG/ML IJ SUSP
40.0000 mg | Freq: Once | INTRAMUSCULAR | Status: AC
  Administered 2021-04-17: 40 mg via INTRA_ARTICULAR

## 2021-04-17 NOTE — Patient Instructions (Signed)
You had an injection today. Things to be aware of after injection are listed below:   . You may experience no significant improvement or even a slight worsening in your symptoms during the first 24 to 48 hours. After that we expect your symptoms to improve gradually over the next 2 weeks for the medicine to have its maximal effect. You should continue to have improvement out to 6 weeks after your injection. . We recommend icing the site of the injection for 20 minutes 1-2 times the day of your injection and as needed for pain over the following several days. . You may shower but no swimming, tub bath or Jacuzzi for 24 hours. . If your bandage falls off this does not need to be replaced. It is appropriate to remove the bandage after 4 hours. . You may resume light activities as tolerated.  It can take several weeks to see improvements following injection. If after 2 weeks you are continuing to have worsening symptoms, please call our office to discuss what the next appropriate actions should be including the potential for a return office visit or other diagnostic testing.  POSSIBLE PROCEDURE SIDE EFFECTS: The side effects of the injection are usually minimal, self-limited, and usually will resolve on their own. Common side effects that can occur over the first several days following injection include:  . Increased numbness or tingling . Worsening pain, stiffness, or slight weakness . Swelling or bruising at the injection site  If you are concerned, please feel free to contact the office with questions.   Please call our office immediately if you experience any of the following symptoms over the next 2 weeks as these can be signs of infection:  . Fever greater than 100.5F . Significant swelling at the injection site . Significant redness or drainage from the injection site  

## 2021-04-17 NOTE — Progress Notes (Addendum)
Office Visit Note   Patient: Nancy Poole           Date of Birth: [birth year]           MRN: [id number] Visit Date: 04/17/2021 Requested by: Silverio Decamp, MD [address] [place],  [place] [address] PCP: Silverio Decamp, MD  Subjective: CC: Left shoulder pain  HPI: 55 year old female with past medical history of thyroid cancer and well-controlled diabetes who is presenting to clinic today with concerns of 5 to 6 months of worsening left shoulder pain.  She states that initially she was told that she had impingement in the left shoulder, and she was given some simple rotator cuff exercises to perform.  She also started to see a chiropractor for her back, as well as some shoulder concerns.  Over the past month, she has noticed increasing stiffness in the left shoulder.  She says she is no longer able to reach behind her back to put her bra on, and she is unable to lift above her head.  Her chiropractor has been working on passive stretching exercises he performs in clinic, which she says offer significant improvement for a day or 2 before the stiffness returns.  She says that she experiences significant pain with trying to lift her arm over her head, which is causing significant trouble at work.  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  She says she is very frustrated with how long she has been uncomfortable, and how it only seems to be getting worse.              Objective: Vital Signs: BP 102/64   Ht 5\' 7"  (1.702 m)   Wt 130 lb (59 kg)   BMI 20.36 kg/m  No flowsheet data found.   No flowsheet data found.  Physical Exam:  General:  Alert and oriented, in no acute distress. Pulm:  Breathing unlabored. Psy:  Normal mood, congruent affect. Skin: Left shoulder without bruises, rashes, or erythema. Overlying skin intact.  Left shoulder Exam:  Inspection: Symmetric muscle mass, no atrophy or  deformity, no scars. Palpation: Endorses mild tenderness over the left AC joint.  No tenderness over the biceps tendon.  No tenderness over the subacromial space.  Range of motion: Significant reduction in left shoulder range of motion, with limited abduction above 90 degrees as well as external rotation limited to approximately 45 degrees and internal rotation similarly limited.  This restriction remains with both active and passive range of motion testing.  Right shoulder with full painless range of motion.  Rotator cuff testing:  Full strength and no pain with empty can (supraspinatus).   External and internal rotation with full strength and no pain.  AC joint testing: Negative scarf test. Negative active compression test.  Biceps/labral testing: O'Brien's/speeds with full strength and no significant worsening of pain. Crank test: With restricted movement above 90 degrees of abduction, though no obvious clicking/popping appreciated.  Strength testing:  5 out of 5 strength with shoulder abduction (C5), wrist extension (C6), wrist flexion (C7), grip strength (C8), and finger abduction (T1).  Sensation: Intact to light touch throughout bilateral upper extremities.   Brisk distal capillary refill.   Imaging: Right shoulder ultrasound: Biceps tendon visualized in long and short axis, with no significant surrounding fluid.  Fibers appear intact. Subscapularis tendon intact, with no significant surrounding effusion. Supraspinatus appears intact, no significant cortical irregularities, effusions,  or intratendinous defects. Does have very mild thickening of the subacromial bursal space. Infraspinatus and teres minor both appear intact, without surrounding fluid. AC joint with somewhat decreased joint spacing as well as positive geyser sign. Glenohumeral joint visualized without significant bony spurring or effusion.  No obvious posterior labral pathology. Posterior capsule appears  hyperechoic.  Impression: No evidence for obvious rotator cuff pathology.  Does have minor AC joint arthropathy.  Increased hyperechoic signaling within the posterior capsule.   Assessment & Plan: 55 year old female presenting to clinic with increasing left shoulder pain and stiffness.  Examination is concerning for adhesive capsulitis, and patient has risk factors which would put her at greater risk for this: Including thyroid cancer, diabetes, as well as what sounds to be prior history of shoulder impingement.  Given her significant discomfort today, risks and benefits of injection therapy were discussed and patient opted to proceed.  CSI performed as described below, which patient tolerated very well.  We will also place referral to physical therapy to assist with range of motion. -Aftercare and return precautions were discussed. -Following injection procedure, OMT was performed using Spencer's technique, which produced immediate improvement in her range of motion.  Following Spencer's technique, patient was able to internally rotate with Apley scratch to approximately level of T5.  Able to ABduct to approximately 130 degrees. -If no benefit with physical therapy and CSI, patient instructed to return to clinic.  Would consider Hydro dilation at that time. -Patient expressed understanding with plan, she had no further questions or concerns today.    Procedures: Left shoulder cortisone Injection:  Risks and benefits of procedure discussed, Patient opted to proceed.  Verbal consent obtained.  Timeout performed.  Skin prepped in a sterile fashion with alcohol. Ethyl Chloride was used for topical analgesia.  Left shoulder was injected with 4cc 1% Lidocaine without epinephrine as well as 40 mg methylprednisolone via the posterior approach under ultrasound guidance.    Patient tolerated the injection well with no immediate complications. Aftercare instructions were discussed, and patient was given  strict return precautions.   Patient seen and evaluated with the sports medicine fellow.  I agree with the above plan of care.  Patient has classic findings of adhesive capsulitis.  Cortisone injection performed as above.  She will start physical therapy at our [place] location.  Follow-up again in 6 weeks.

## 2021-05-06 ENCOUNTER — Ambulatory Visit (HOSPITAL_BASED_OUTPATIENT_CLINIC_OR_DEPARTMENT_OTHER): Payer: No Typology Code available for payment source | Admitting: Physical Therapy

## 2021-05-14 ENCOUNTER — Ambulatory Visit (HOSPITAL_BASED_OUTPATIENT_CLINIC_OR_DEPARTMENT_OTHER): Payer: No Typology Code available for payment source | Attending: Sports Medicine | Admitting: Physical Therapy

## 2021-05-14 ENCOUNTER — Other Ambulatory Visit: Payer: Self-pay

## 2021-05-14 ENCOUNTER — Encounter (HOSPITAL_BASED_OUTPATIENT_CLINIC_OR_DEPARTMENT_OTHER): Payer: Self-pay | Admitting: Physical Therapy

## 2021-05-14 DIAGNOSIS — M25512 Pain in left shoulder: Secondary | ICD-10-CM | POA: Insufficient documentation

## 2021-05-14 DIAGNOSIS — G8929 Other chronic pain: Secondary | ICD-10-CM | POA: Diagnosis not present

## 2021-05-14 DIAGNOSIS — M6281 Muscle weakness (generalized): Secondary | ICD-10-CM | POA: Insufficient documentation

## 2021-05-14 DIAGNOSIS — M25612 Stiffness of left shoulder, not elsewhere classified: Secondary | ICD-10-CM

## 2021-05-14 NOTE — Therapy (Signed)
[place], [place], [address] Phone: [phone]   Fax:  [phone]  Physical Therapy Evaluation  Patient Details  Name: Nancy Poole MRN: [id number] Date of Birth: Sep 21, 1966 Referring Provider (PT): Dr. Lilia Argue   Encounter Date: 05/14/2021   PT End of Session - 05/14/21 1411     Visit Number 1    Number of Visits 12    Date for PT Re-Evaluation 06/25/21    Authorization Type [employer]/[place] Preferred    PT Start Time 0851    PT Stop Time 0951    PT Time Calculation (min) 60 min    Activity Tolerance Patient tolerated treatment well    Behavior During Therapy [facility] for tasks assessed/performed             Past Medical History:  Diagnosis Date   Cancer ([place])    Diabetes mellitus without complication ([place])    Herniated disc    Hypertension     Past Surgical History:  Procedure Laterality Date   THYROIDECTOMY      There were no vitals filed for this visit.    Subjective Assessment - 05/14/21 0910     Subjective Pt reports no specific MOI.  Pain began in February.  Pt was seeing the chiropractor which did provide some relief.  Over the past 56 months, her pain and stiffness have been  worsening.  Pt did receive some RTC exercises from MD.  Pt saw Dr. Micheline Chapman on 56/9/22 who performed an [place] and ordered a cortisone injection.  Pt reports 10% improvement with the cortisone shot.  He ordered PT and referral indicated adhesive capsulitis.  Pt has difficulty with reaching behind back and unable to clasp/doff bra.  She is very limited with reaching activities including overhead activities.  Pain and difficulty with bathing, applying deodorant, and dressing.  Pt has pain and is limited with using L UE with household chores.  Not using L UE as much with driving.  She has difficulty sleeping.  Pt has pain with and is limited with work activities including lifting clothes.    Pertinent History DM, Hx of  lumbar pain/radiculopathy with lumbar microdiscectomy in 2014 and post laminectomy syndrome, thyroid CA and had thyroid removed in 2011.    Limitations House hold activities;Lifting;Other (comment)   reaching, overhead activities   How long can you sit comfortably? N/A    How long can you stand comfortably? N/A    How long can you walk comfortably? N/A    Diagnostic tests US showed no evidence of RTC pathology, minor AC jt pathology, and increased signaling in posterior capsule per MD note.    Patient Stated Goals To have full ROM and full strength.  to be able to dress and apply deodorant.  improved function.    Currently in Pain? Yes    Pain Score 6    Worst Pain:  10/10, Best pain:  5/10 pain   Pain Location Shoulder    Pain Orientation Left    Pain Descriptors / Indicators Constant    Pain Type Chronic pain    Pain Onset More than a month ago    Pain Frequency Constant    Aggravating Factors  reaching bwd, reaching overhead, lifting, activity    Pain Relieving Factors cortisone injection    Effect of Pain on Daily Activities Limits ADLs/IADLs inclduing bathing, dressing, and household chores  [facility] PT Assessment - 05/14/21 0001       Assessment   Medical Diagnosis Chronic L shoulder pain/Adhesive capsulitis    Referring Provider (PT) Dr. Lilia Argue    Onset Date/Surgical Date --   56/2022   Hand Dominance Right    Next MD Visit Pt doesn't have a follow up.    Prior Therapy None for shoulder, but has received chiropractor      Precautions   Precautions None      Restrictions   Weight Bearing Restrictions No      Home Environment   Living Environment Private residence    Additional Comments One story home.  Lives with husband      Prior Function   Level of Independence Independent    Vocation Requirements works as a Scientist, research (medical).  Stands most of the day, has to lift clothes.      Cognition   Overall Cognitive Status Within Functional  Limits for tasks assessed      ROM / Strength   AROM / PROM / Strength AROM;PROM;Strength      AROM   Overall AROM Comments Pain with L shoulder AROM.  Scaption:  R/l:  164/81    AROM Assessment Site Shoulder    Right/Left Shoulder Right;Left    Right Shoulder Flexion 161 Degrees    Right Shoulder ABduction 141 Degrees    Right Shoulder Internal Rotation 75 Degrees    Right Shoulder External Rotation 79 Degrees    Left Shoulder Flexion 89 Degrees    Left Shoulder ABduction 76 Degrees    Left Shoulder Internal Rotation 43 Degrees    Left Shoulder External Rotation 50 Degrees      PROM   PROM Assessment Site Shoulder    Right/Left Shoulder Left    Left Shoulder Flexion 104 Degrees    Left Shoulder ABduction 79 Degrees    Left Shoulder Internal Rotation 40 Degrees    Left Shoulder External Rotation 54 Degrees      Strength   Strength Assessment Site Shoulder    Right/Left Shoulder Right;Left    Right Shoulder Internal Rotation 5/5    Right Shoulder External Rotation 5/5    Left Shoulder Internal Rotation --   4-/5 w/n available ROM   Left Shoulder External Rotation --   tolerated minimal resistance; weak     Palpation   Palpation comment TTP t/o L shoulder with less tenderness in posterior shoulder      Special Tests    Special Tests Rotator Cuff Impingement    Rotator Cuff Impingment tests Neer impingement test;Hawkins- Kennedy test      Neer Impingement test    Comments Negative on R.  Unable to move shoulder through full ROM, very limited and painful      Hawkins-Kennedy test   Comments positive on L though minimal pain                        Objective measurements completed on examination: See above findings.       [facility] Adult PT Treatment/Exercise - 05/14/21 0001       Exercises   Exercises Shoulder   Performed ther ex to improve pain, ROM, stiffness, and function.  Educated pt in [place], dx, and the stages of adhesive capsulitis.  Pt received a  HEP handout and was educated in correct form and appropriate frequency.     Shoulder Exercises: Supine   Other Supine Exercises Supine wand flexion  x 10 reps, supine clasped hands flexion approx 15 reps, and wand ER x 12 reps      Shoulder Exercises: Seated   Other Seated Exercises seated table slides (bilat) x 10 reps      Shoulder Exercises: Standing   Other Standing Exercises standing wand IR x 12 reps                    PT Education - 05/14/21 1055     Education Details Educated pt in [place] and answered Pt's questions.  Educated pt in dx and stages of adhesive capsulitis.  Updated HEP and gave pt a HEP handout.  Educated pt in correct form and appropriate frequency.  Access Code: [email or web address]  URL: [email or web address]  Date: 05/14/2021  Prepared by: Ronny Flurry    Exercises  Supine Shoulder Flexion AAROM with Hands Clasped - 2 x daily - 7 x weekly - 2 sets - 10 reps  Supine Shoulder External Rotation in 45 Degrees Abduction AAROM with Dowel - 2 x daily - 7 x weekly - 2 sets - 10 reps  Standing Bilateral Shoulder Internal Rotation AAROM with Dowel - 2 x daily - 7 x weekly - 2 sets - 10 reps  Seated Shoulder Flexion Towel Slide at Table Top - 2 x daily - 7 x weekly - 2 sets - 10 reps.  instructed pt in using ice if she had increased pain or soreness after Rx.    Person(s) Educated Patient    Methods Explanation;Demonstration;Tactile cues;Verbal cues;Handout    Comprehension Verbalized understanding;Returned demonstration              PT Short Term Goals - 05/14/21 1434       PT SHORT TERM GOAL #1   Title Pt will be independent and compliant with HEP for improved pain, ROM, strength, and function.    Time 3    Period Weeks    Status New    Target Date 06/04/21      PT SHORT TERM GOAL #2   Title Pt will report at least a 25% improvement with shoulder mobility with ADLs and reaching.    Time 3    Period Weeks    Status New    Target Date 06/04/21       PT SHORT TERM GOAL #3   Title Pt will report she is able to drive without significant pain.    Time 3    Period Weeks    Status New    Target Date 06/04/21      PT SHORT TERM GOAL #4   Title Pt will demonstrate improved L shoulder AROM to at least 120 deg in flexion, 115 deg in scaption, 60 deg in ER, and 55 deg in IR for improved shoulder mobility and performance of ADLs.    Baseline L shoulder AROM: flex:  89 deg,  scaption:  81 deg, ER:  50 deg, IR:  43 deg    Time 3    Period Weeks    Status New    Target Date 06/04/21      PT SHORT TERM GOAL #5   Title Pt will be able to apply deodorant with no > than minimal difficulty.    Time 3    Period Weeks    Status New    Target Date 06/04/21               PT Long Term Goals - 05/14/21 1440  PT LONG TERM GOAL #1   Title Pt will be able to reach overhead into a cabinet to grab a cup or plate without significant pain.    Time 6    Period Weeks    Status New    Target Date 06/25/21      PT LONG TERM GOAL #2   Title Pt will be able to perform bathing and dressing without difficulty and signifiant pain.    Time 6    Period Weeks    Status New    Target Date 06/25/21      PT LONG TERM GOAL #3   Title Pt will demo L shoulder AROM to be at least 150 deg in flexion, 120 deg in abd, 140 deg in scaption, and 65-70 deg in ER for improved stiffness and performance of ADLs/IADLs.    Baseline L shoulder AROM: flex: 89 deg, scaption: 81 deg, ER: 50 deg, IR: 43 deg    Time 6    Period Weeks    Status New    Target Date 06/25/21      PT LONG TERM GOAL #4   Title Pt will report she is able to perform her household chores and normal reaching and overhead activities without significant pain or difficulty.    Time 6    Period Weeks    Status New    Target Date 06/25/21      PT LONG TERM GOAL #5   Title Pt will demo improved L shoulder including RTC strength to at least 4+5 MMT t/o for improved lifting and performance of work  activities.    Time 6    Period Weeks    Status New    Target Date 06/25/21                    Plan - 05/14/21 1413     Clinical Impression Statement Pt is a 55 y/o female with a dx of chronic L shoulder pain and adhesive capsulitis presenting to the clinic with L shoulder pain, limited ROM in L shoulder, and muscle weakness in L shoulder.  Pt has pain and is limited with performing ADLs/IADLs including bathing, dressing, driving, and household chores.  She is limited with performing reaching and overhead activities due to pain and tightness.  Pt is limited with lifting activities including at work.  Pt has pain and tightness with ROM testing.  Pt should benefit from skilled PT services to improve ROM, pain, strength, and to restore PLOF.    Personal Factors and Comorbidities Comorbidity 1    Comorbidities DM    Examination-Activity Limitations Bathing;Carry;Dressing;Lift;Sleep;Reach Overhead    Examination-Participation Restrictions Cleaning;Driving;Laundry;Occupation    Stability/Clinical Decision Making Stable/Uncomplicated    Clinical Decision Making Low    Rehab Potential Good    PT Frequency 2x / week    PT Duration 6 weeks    PT Treatment/Interventions ADLs/Self Care Home Management;Cryotherapy;Electrical Stimulation;Ultrasound;Moist Heat;Therapeutic activities;Therapeutic exercise;Neuromuscular re-education;Manual techniques;Patient/family education;Passive range of motion;Taping    PT Next Visit Plan Review and perform HEP.  PROM and joint mobs to L shoulder.  ice as needed to reduce pain and soreness.    PT Home Exercise Plan Access Code: [email or web address] URL: [email or web address] Date: 05/14/2021 Prepared by: Ronny Flurry Exercises Supine Shoulder Flexion AAROM with Hands Clasped - 2 x daily - 7 x weekly - 2 sets - 10 reps Supine Shoulder External Rotation in 45 Degrees Abduction AAROM with Dowel - 2 x daily - 7 x weekly -  2 sets - 10 reps Standing Bilateral  Shoulder Internal Rotation AAROM with Dowel - 2 x daily - 7 x weekly - 2 sets - 10 reps Seated Shoulder Flexion Towel Slide at Table Top - 2 x daily - 7 x weekly - 2 sets - 10 reps.    Consulted and Agree with Plan of Care Patient             Patient will benefit from skilled therapeutic intervention in order to improve the following deficits and impairments:  Decreased strength, Decreased range of motion, Hypomobility, Impaired UE functional use, Pain  Visit Diagnosis: Chronic left shoulder pain  Stiffness of left shoulder, not elsewhere classified  Muscle weakness (generalized)     Problem List Patient Active Problem List   Diagnosis Date Noted   Labral tear of right hip joint 12/02/2020   Toe swelling 12/01/2018   Primary osteoarthritis of left first IP joint 11/08/2018   History of cholecystectomy 09/05/2018   Travel advice encounter 07/25/2018   Right foot pain 04/11/2018   Impingement syndrome of both shoulders 01/04/2018   Ovarian cyst 07/21/2017   Bruxism 04/28/2016   Incomplete bladder emptying 07/31/2014   Hypothyroidism 07/24/2014   Diabetes mellitus, type 2 ([place]) 03/07/2014   Hypertension 12/20/2013   Insomnia 12/20/2013   Postlaminectomy syndrome 11/13/2013   GERD (gastroesophageal reflux disease) 10/23/2013   Annual physical exam 10/23/2013   Bilateral plantar fasciitis 10/03/2013   HLD (hyperlipidemia) 09/11/2013   Lung nodule, solitary 08/24/2012   Otoporosis 08/12/2010   Primary papillary carcinoma of thyroid, follicular subtype, post thyroidectomy 03/17/2010    Selinda Michaels III PT, DPT 05/14/21 4:38 PM  [facility] [address] [place], [place], [address] Phone: [phone]   Fax:  [phone]  Name: Titianna Loomis MRN: [id number] Date of Birth: Mar 24, 1966

## 2021-05-16 ENCOUNTER — Other Ambulatory Visit: Payer: Self-pay

## 2021-05-16 ENCOUNTER — Encounter (HOSPITAL_BASED_OUTPATIENT_CLINIC_OR_DEPARTMENT_OTHER): Payer: Self-pay | Admitting: Physical Therapy

## 2021-05-16 ENCOUNTER — Ambulatory Visit (HOSPITAL_BASED_OUTPATIENT_CLINIC_OR_DEPARTMENT_OTHER): Payer: No Typology Code available for payment source | Admitting: Physical Therapy

## 2021-05-16 DIAGNOSIS — G8929 Other chronic pain: Secondary | ICD-10-CM

## 2021-05-16 DIAGNOSIS — M6281 Muscle weakness (generalized): Secondary | ICD-10-CM

## 2021-05-16 DIAGNOSIS — M25612 Stiffness of left shoulder, not elsewhere classified: Secondary | ICD-10-CM

## 2021-05-16 NOTE — Therapy (Signed)
[facility] [address] [place], [place], [address] Phone: [phone]   Fax:  [phone]  Physical Therapy Treatment  Patient Details  Name: Nancy Poole MRN: [id number] Date of Birth: 10/13/1966 Referring Provider (PT): Dr. Lilia Argue   Encounter Date: 05/16/2021    Past Medical History:  Diagnosis Date   Cancer ([place])    Diabetes mellitus without complication ([place])    Herniated disc    Hypertension     Past Surgical History:  Procedure Laterality Date   THYROIDECTOMY      There were no vitals filed for this visit.   Subjective Assessment - 05/16/21 0847     Subjective Pt reports increased pain after evaluation and tried heat and ice which didn't provide relief.  Pt reports having significant pain with HEP.  Pt states she had increased pain at work after performing HEP.  Pt states she is unable to take any meds due to having a back injection on 05/23/21.  Pt states her UT feels tight.  She states ice only feels good while it's on her shoulder.    Pertinent History DM, Hx of lumbar pain/radiculopathy with lumbar microdiscectomy in 2014 and post laminectomy syndrome, thyroid CA and had thyroid removed in 2011.    Limitations House hold activities;Lifting;Other (comment)    How long can you sit comfortably? N/A    How long can you stand comfortably? N/A    How long can you walk comfortably? N/A    Diagnostic tests US showed no evidence of RTC pathology, minor AC jt pathology, and increased signaling in posterior capsule per MD note.    Patient Stated Goals To have full ROM and full strength.  to be able to dress and apply deodorant.  improved function.    Currently in Pain? Yes    Pain Score --   7-8/10 pain   Pain Location Shoulder    Pain Orientation Left                [facility] PT Assessment - 05/16/21 0001       AROM   AROM Assessment Site Shoulder    Right/Left Shoulder Left      PROM   PROM  Assessment Site Shoulder    Right/Left Shoulder Left    Left Shoulder Flexion 117 Degrees    Left Shoulder ABduction 88 Degrees    Left Shoulder Internal Rotation 42 Degrees    Left Shoulder External Rotation 52 Degrees                           [facility] Adult PT Treatment/Exercise - 05/16/21 0001       Exercises   Exercises --   Reviewed pt presentation, pain levels, home management strategies, and current function.  Assessed ROM.  Ther ex performed in order to improve pain, ROM, and function.     Shoulder Exercises: Supine   Other Supine Exercises supine clasped hands flexion approx 3 reps and supine wand ER 2x10      Shoulder Exercises: Seated   Other Seated Exercises seated table slides (bilat) 2x 10 reps, seated pulleys x 10 reps in flex and scaption, and seated wand ER 2 x 10 reps      Shoulder Exercises: Standing   Other Standing Exercises standing wand IR x 12 reps      Manual Therapy   Manual Therapy Joint mobilization;Passive ROM    Joint Mobilization Joint distraction with oscillation  and Grade 2 AP, PA, and Inf jt mobs to L GH jt to improve ROM, stiffness, pain, and to normalize arthrokinematics    Passive ROM f/b L shoulder PROM in flex, abd, ER, and IR in supine per jt stiffness and pt tolerance.                    PT Education - 05/16/21 0915     Education Details Educated pt in [place] and process of PT.  Provided reasurrance of sx's and process.  Educated pt to not perform home exercises into painful range and instructed pt in correct form.  Updated HEP including removing supine clasped hands flexoin AAROM, reduced 1 set of supine wand ER, and added seated wand ER.  Gave pt a HEP handout and educated pt in correct form and appropriate frequency.  Access Code: [email or web address]  URL: [email or web address]  Date: 05/16/2021  Prepared by: Ronny Flurry    Exercises  Supine Shoulder External Rotation in 45 Degrees Abduction AAROM with Dowel - 2 x  daily - 7 x weekly - 1 sets - 10 reps  Standing Bilateral Shoulder Internal Rotation AAROM with Dowel - 2 x daily - 7 x weekly - 2 sets - 10 reps  Seated Shoulder Flexion Towel Slide at Table Top - 2 x daily - 7 x weekly - 2 sets - 10 reps  Seated Shoulder External Rotation AAROM with Dowel - 2 x daily - 7 x weekly - 1-2 sets - 10 reps    Person(s) Educated Patient    Methods Explanation;Demonstration;Tactile cues;Verbal cues;Handout    Comprehension Verbalized understanding;Returned demonstration              PT Short Term Goals - 05/14/21 1434       PT SHORT TERM GOAL #1   Title Pt will be independent and compliant with HEP for improved pain, ROM, strength, and function.    Time 3    Period Weeks    Status New    Target Date 06/04/21      PT SHORT TERM GOAL #2   Title Pt will report at least a 25% improvement with shoulder mobility with ADLs and reaching.    Time 3    Period Weeks    Status New    Target Date 06/04/21      PT SHORT TERM GOAL #3   Title Pt will report she is able to drive without significant pain.    Time 3    Period Weeks    Status New    Target Date 06/04/21      PT SHORT TERM GOAL #4   Title Pt will demonstrate improved L shoulder AROM to at least 120 deg in flexion, 115 deg in scaption, 60 deg in ER, and 55 deg in IR for improved shoulder mobility and performance of ADLs.    Baseline L shoulder AROM: flex:  89 deg,  scaption:  81 deg, ER:  50 deg, IR:  43 deg    Time 3    Period Weeks    Status New    Target Date 06/04/21      PT SHORT TERM GOAL #5   Title Pt will be able to apply deodorant with no > than minimal difficulty.    Time 3    Period Weeks    Status New    Target Date 06/04/21               PT Long Term Goals -  05/14/21 1440       PT LONG TERM GOAL #1   Title Pt will be able to reach overhead into a cabinet to grab a cup or plate without significant pain.    Time 6    Period Weeks    Status New    Target Date 06/25/21       PT LONG TERM GOAL #2   Title Pt will be able to perform bathing and dressing without difficulty and signifiant pain.    Time 6    Period Weeks    Status New    Target Date 06/25/21      PT LONG TERM GOAL #3   Title Pt will demo L shoulder AROM to be at least 150 deg in flexion, 120 deg in abd, 140 deg in scaption, and 65-70 deg in ER for improved stiffness and performance of ADLs/IADLs.    Baseline L shoulder AROM: flex: 89 deg, scaption: 81 deg, ER: 50 deg, IR: 43 deg    Time 6    Period Weeks    Status New    Target Date 06/25/21      PT LONG TERM GOAL #4   Title Pt will report she is able to perform her household chores and normal reaching and overhead activities without significant pain or difficulty.    Time 6    Period Weeks    Status New    Target Date 06/25/21      PT LONG TERM GOAL #5   Title Pt will demo improved L shoulder including RTC strength to at least 4+5 MMT t/o for improved lifting and performance of work activities.    Time 6    Period Weeks    Status New    Target Date 06/25/21                   Plan - 05/16/21 0945     Clinical Impression Statement Pt presents to Rx reporting increased pain after eval and with performing HEP.  She is limited with ADLs, household chores, and reaching and overhead activities.  Pt is very limted and has pain with attempting supine clapsed hands flexion and PT removed from HEP.  Added pulleys to ther ex and Pt tolerated pulleys well.  Pt required verbal and tactile cuing for correct form with supine wand ER.  She has tightness, limitations, and pain with shoulder PROM.  Pt demonstrates improved flexion and abduction PROM.  Pt responded well to Rx having no increased pain after Rx.  Pt declined ice after Rx and states ice doesn't really help.    Personal Factors and Comorbidities Comorbidity 1    Comorbidities DM    Examination-Activity Limitations Bathing;Carry;Dressing;Lift;Sleep;Reach Overhead    PT  Treatment/Interventions ADLs/Self Care Home Management;Cryotherapy;Electrical Stimulation;Ultrasound;Moist Heat;Therapeutic activities;Therapeutic exercise;Neuromuscular re-education;Manual techniques;Patient/family education;Passive range of motion;Taping    PT Next Visit Plan Review and perform HEP.  PROM and joint mobs to L shoulder.  Add standing horiz add with wand and seated UT stretch next visit.  ice as needed to reduce pain and soreness.    PT Home Exercise Plan Access Code: [email or web address]  URL: [email or web address]  Date: 05/16/2021  Prepared by: Ronny Flurry    Exercises  Supine Shoulder External Rotation in 45 Degrees Abduction AAROM with Dowel - 2 x daily - 7 x weekly - 1 sets - 10 reps  Standing Bilateral Shoulder Internal Rotation AAROM with Dowel - 2 x daily - 7 x weekly - 2 sets -  10 reps  Seated Shoulder Flexion Towel Slide at Table Top - 2 x daily - 7 x weekly - 2 sets - 10 reps  Seated Shoulder External Rotation AAROM with Dowel - 2 x daily - 7 x weekly - 1-2 sets - 10 reps    Consulted and Agree with Plan of Care Patient             Patient will benefit from skilled therapeutic intervention in order to improve the following deficits and impairments:  Decreased strength, Decreased range of motion, Hypomobility, Impaired UE functional use, Pain  Visit Diagnosis: Chronic left shoulder pain  Stiffness of left shoulder, not elsewhere classified  Muscle weakness (generalized)     Problem List Patient Active Problem List   Diagnosis Date Noted   Labral tear of right hip joint 12/02/2020   Toe swelling 12/01/2018   Primary osteoarthritis of left first IP joint 11/08/2018   History of cholecystectomy 09/05/2018   Travel advice encounter 07/25/2018   Right foot pain 04/11/2018   Impingement syndrome of both shoulders 01/04/2018   Ovarian cyst 07/21/2017   Bruxism 04/28/2016   Incomplete bladder emptying 07/31/2014   Hypothyroidism 07/24/2014   Diabetes  mellitus, type 2 ([place]) 03/07/2014   Hypertension 12/20/2013   Insomnia 12/20/2013   Postlaminectomy syndrome 11/13/2013   GERD (gastroesophageal reflux disease) 10/23/2013   Annual physical exam 10/23/2013   Bilateral plantar fasciitis 10/03/2013   HLD (hyperlipidemia) 09/11/2013   Lung nodule, solitary 08/24/2012   Otoporosis 08/12/2010   Primary papillary carcinoma of thyroid, follicular subtype, post thyroidectomy 03/17/2010    Selinda Michaels III PT, DPT 05/16/21 10:00 AM   [facility] [address] [place], [place], [address] Phone: [phone]   Fax:  [phone]  Name: Nancy Poole MRN: [id number] Date of Birth: [birth year]

## 2021-05-19 ENCOUNTER — Other Ambulatory Visit: Payer: Self-pay

## 2021-05-19 ENCOUNTER — Ambulatory Visit (HOSPITAL_BASED_OUTPATIENT_CLINIC_OR_DEPARTMENT_OTHER): Payer: No Typology Code available for payment source | Admitting: Physical Therapy

## 2021-05-19 ENCOUNTER — Encounter (HOSPITAL_BASED_OUTPATIENT_CLINIC_OR_DEPARTMENT_OTHER): Payer: Self-pay | Admitting: Physical Therapy

## 2021-05-19 DIAGNOSIS — M6281 Muscle weakness (generalized): Secondary | ICD-10-CM

## 2021-05-19 DIAGNOSIS — G8929 Other chronic pain: Secondary | ICD-10-CM

## 2021-05-19 DIAGNOSIS — M25512 Pain in left shoulder: Secondary | ICD-10-CM

## 2021-05-19 DIAGNOSIS — M25612 Stiffness of left shoulder, not elsewhere classified: Secondary | ICD-10-CM

## 2021-05-19 NOTE — Therapy (Signed)
[facility] [address] [place], [place], [address] Phone: [phone]   Fax:  [phone]  Physical Therapy Treatment  Patient Details  Name: Nancy Poole MRN: [id number] Date of Birth: 1966/02/18 Referring Provider (PT): Dr. Lilia Argue   Encounter Date: 05/19/2021   PT End of Session - 05/19/21 1229     Visit Number 3    Number of Visits 12    Date for PT Re-Evaluation 06/25/21    Authorization Type [employer]/[place] Preferred    PT Start Time 0935    PT Stop Time 1018    PT Time Calculation (min) 43 min    Activity Tolerance Patient tolerated treatment well    Behavior During Therapy [facility] for tasks assessed/performed             Past Medical History:  Diagnosis Date   Cancer ([place])    Diabetes mellitus without complication ([place])    Herniated disc    Hypertension     Past Surgical History:  Procedure Laterality Date   THYROIDECTOMY      There were no vitals filed for this visit.   Subjective Assessment - 05/19/21 0936     Subjective Pt states she felt the same as last Rx.  She had increased pain after prior Rx.  Her shoulder was aggravated the following day.  Pt states she is unable to take any meds due to having a back injection on 05/23/21.  Pt states she is performing her HEP and feels that her shoulder is tighter.  She continues to have significant pain with HEP even though PT modified her exercises.  Pt continues to have difficulty sleeping due to pain and is a little worse. Pt states her UT feels tight.  She states ice only feels good while it's on her shoulder.    Pertinent History DM, Hx of lumbar pain/radiculopathy with lumbar microdiscectomy in 2014 and post laminectomy syndrome, thyroid CA and had thyroid removed in 2011.    Currently in Pain? Yes    Pain Score --   7-8/10   Pain Location Shoulder    Pain Orientation Left                               [facility] Adult PT  Treatment/Exercise - 05/19/21 0001       Exercises   Exercises Shoulder   Reviewed pt presentation, pain levels, home management strategies, and current function.  Ther ex performed in order to improve pain, ROM, and function.     Shoulder Exercises: Supine   Other Supine Exercises supine wand ER 2x10 reps      Shoulder Exercises: Seated   Other Seated Exercises seated UT stretch 3x20 seconds      Shoulder Exercises: Standing   Other Standing Exercises standing wand IR 2x10 reps, standing hz add with wand 2x10 reps.      Manual Therapy   Manual Therapy Soft tissue mobilization;Passive ROM;Joint mobilization    Joint Mobilization Joint distraction with oscillation and Grade 2 AP, PA, and Inf jt mobs to L GH jt to improve ROM, stiffness, pain, and to normalize arthrokinematics    Soft tissue mobilization STM to L UT, medial scap mm, and post delt and post cuff to improve soft tissue tightness and pain.    Passive ROM f/b L shoulder PROM in flex, abd, ER, and IR in supine per jt stiffness and pt tolerance.  PT Education - 05/19/21 1229     Education Details Educated pt in dx including stages of adhesive capsulitis and what to expect.  Educated pt in relevant anatomy and rationale of MT and ther ex.  Instructed pt to cont with HEP.  Correct form with exercises.    Person(s) Educated Patient    Methods Explanation;Demonstration;Tactile cues;Verbal cues    Comprehension Verbalized understanding;Returned demonstration;Verbal cues required;Tactile cues required              PT Short Term Goals - 05/14/21 1434       PT SHORT TERM GOAL #1   Title Pt will be independent and compliant with HEP for improved pain, ROM, strength, and function.    Time 3    Period Weeks    Status New    Target Date 06/04/21      PT SHORT TERM GOAL #2   Title Pt will report at least a 25% improvement with shoulder mobility with ADLs and reaching.    Time 3    Period Weeks     Status New    Target Date 06/04/21      PT SHORT TERM GOAL #3   Title Pt will report she is able to drive without significant pain.    Time 3    Period Weeks    Status New    Target Date 06/04/21      PT SHORT TERM GOAL #4   Title Pt will demonstrate improved L shoulder AROM to at least 120 deg in flexion, 115 deg in scaption, 60 deg in ER, and 55 deg in IR for improved shoulder mobility and performance of ADLs.    Baseline L shoulder AROM: flex:  89 deg,  scaption:  81 deg, ER:  50 deg, IR:  43 deg    Time 3    Period Weeks    Status New    Target Date 06/04/21      PT SHORT TERM GOAL #5   Title Pt will be able to apply deodorant with no > than minimal difficulty.    Time 3    Period Weeks    Status New    Target Date 06/04/21               PT Long Term Goals - 05/14/21 1440       PT LONG TERM GOAL #1   Title Pt will be able to reach overhead into a cabinet to grab a cup or plate without significant pain.    Time 6    Period Weeks    Status New    Target Date 06/25/21      PT LONG TERM GOAL #2   Title Pt will be able to perform bathing and dressing without difficulty and signifiant pain.    Time 6    Period Weeks    Status New    Target Date 06/25/21      PT LONG TERM GOAL #3   Title Pt will demo L shoulder AROM to be at least 150 deg in flexion, 120 deg in abd, 140 deg in scaption, and 65-70 deg in ER for improved stiffness and performance of ADLs/IADLs.    Baseline L shoulder AROM: flex: 89 deg, scaption: 81 deg, ER: 50 deg, IR: 43 deg    Time 6    Period Weeks    Status New    Target Date 06/25/21      PT LONG TERM GOAL #4  Title Pt will report she is able to perform her household chores and normal reaching and overhead activities without significant pain or difficulty.    Time 6    Period Weeks    Status New    Target Date 06/25/21      PT LONG TERM GOAL #5   Title Pt will demo improved L shoulder including RTC strength to at least 4+5 MMT t/o  for improved lifting and performance of work activities.    Time 6    Period Weeks    Status New    Target Date 06/25/21                   Plan - 05/19/21 1203     Clinical Impression Statement Pt presents to Rx with continued c/o's of significant pain and tightness including stating she feels that her shoulder is getting tighter.  Pt repors having increased pain after prior Rx and with HEP even though PT modified her HEP last visit.  She has tightness and limitaitons with L shoulder PROM.  She demonstrates improved L shoulder PROM t/o as evidenced by visual observation.  Pt has soft tissue tightness and mm spasm in L UT.  She has tightness and some tenderness in posterior shoulder and medial scap mm.  Pt responded well to [facility] stating she felt a little better, more loose after soft tissue work.  Pt requires verbal and tactile cuing for correct form and positioning with supine wand flexion.  Decreased ther ex today and focused on MT due to pt's response to prior Rx.  Pt stated she felt better after Rx having reduced tightness and being able to move her shoulder better.    Personal Factors and Comorbidities Comorbidity 1    Comorbidities DM    Examination-Activity Limitations Bathing;Carry;Dressing;Lift;Sleep;Reach Overhead    Examination-Participation Restrictions Cleaning;Driving;Laundry;Occupation    PT Treatment/Interventions ADLs/Self Care Home Management;Cryotherapy;Electrical Stimulation;Ultrasound;Moist Heat;Therapeutic activities;Therapeutic exercise;Neuromuscular re-education;Manual techniques;Patient/family education;Passive range of motion;Taping    PT Home Exercise Plan Access Code: [email or web address].  No changes made to HEP today    Consulted and Agree with Plan of Care Patient             Patient will benefit from skilled therapeutic intervention in order to improve the following deficits and impairments:  Decreased strength, Decreased range of motion, Hypomobility, Impaired UE  functional use, Pain  Visit Diagnosis: Chronic left shoulder pain  Stiffness of left shoulder, not elsewhere classified  Muscle weakness (generalized)     Problem List Patient Active Problem List   Diagnosis Date Noted   Labral tear of right hip joint 12/02/2020   Toe swelling 12/01/2018   Primary osteoarthritis of left first IP joint 11/08/2018   History of cholecystectomy 09/05/2018   Travel advice encounter 07/25/2018   Right foot pain 04/11/2018   Impingement syndrome of both shoulders 01/04/2018   Ovarian cyst 07/21/2017   Bruxism 04/28/2016   Incomplete bladder emptying 07/31/2014   Hypothyroidism 07/24/2014   Diabetes mellitus, type 2 ([place]) 03/07/2014   Hypertension 12/20/2013   Insomnia 12/20/2013   Postlaminectomy syndrome 11/13/2013   GERD (gastroesophageal reflux disease) 10/23/2013   Annual physical exam 10/23/2013   Bilateral plantar fasciitis 10/03/2013   HLD (hyperlipidemia) 09/11/2013   Lung nodule, solitary 08/24/2012   Otoporosis 08/12/2010   Primary papillary carcinoma of thyroid, follicular subtype, post thyroidectomy 03/17/2010    Selinda Michaels III PT, DPT 05/19/21 12:39 PM   [facility] [place]  [place], [place], [address] Phone: [phone]   Fax:  [phone]  Name: Nancy Poole MRN: [id number] Date of Birth: 10-28-1966

## 2021-05-22 ENCOUNTER — Ambulatory Visit (HOSPITAL_BASED_OUTPATIENT_CLINIC_OR_DEPARTMENT_OTHER): Payer: No Typology Code available for payment source | Admitting: Physical Therapy

## 2021-05-22 ENCOUNTER — Encounter (HOSPITAL_BASED_OUTPATIENT_CLINIC_OR_DEPARTMENT_OTHER): Payer: Self-pay | Admitting: Physical Therapy

## 2021-05-22 ENCOUNTER — Other Ambulatory Visit: Payer: Self-pay

## 2021-05-22 DIAGNOSIS — G8929 Other chronic pain: Secondary | ICD-10-CM | POA: Diagnosis not present

## 2021-05-22 DIAGNOSIS — M25612 Stiffness of left shoulder, not elsewhere classified: Secondary | ICD-10-CM

## 2021-05-22 DIAGNOSIS — M25512 Pain in left shoulder: Secondary | ICD-10-CM

## 2021-05-22 DIAGNOSIS — M6281 Muscle weakness (generalized): Secondary | ICD-10-CM

## 2021-05-22 NOTE — Therapy (Signed)
[facility] [place], [place], [address] Phone: [phone]   Fax:  [phone]  Physical Therapy Treatment  Patient Details  Name: Nancy Poole MRN: [id number] Date of Birth: 1966-10-26 Referring Provider (PT): Dr. Lilia Argue   Encounter Date: 05/22/2021   PT End of Session - 05/22/21 2058     Visit Number 4    Number of Visits 12    Date for PT Re-Evaluation 06/25/21    Authorization Type [employer]etna/[place] Preferred    PT Start Time 0857    PT Stop Time 0936    PT Time Calculation (min) 39 min    Activity Tolerance Patient tolerated treatment well    Behavior During Therapy [facility] for tasks assessed/performed             Past Medical History:  Diagnosis Date   Cancer ([place])    Diabetes mellitus without complication ([place])    Herniated disc    Hypertension     Past Surgical History:  Procedure Laterality Date   THYROIDECTOMY      There were no vitals filed for this visit.   Subjective Assessment - 05/22/21 0900     Subjective Pt states she felt good for 24 hrs after prior Rx and had reduced stiffness in shoulder.  Pt continues to have pain with her HEP.  Pt sees MD on 05/29/21.  She continues to have pain and limitations with ADLs/IADLs and reaching activities.  pt has disturbed sleep due to pain. Pt has a back injection tomorrow    Pertinent History DM, Hx of lumbar pain/radiculopathy with lumbar microdiscectomy in 2014 and post laminectomy syndrome, thyroid CA and had thyroid removed in 2011.    Patient Stated Goals To have full ROM and full strength.  to be able to dress and apply deodorant.  improved function.    Currently in Pain? Yes    Pain Score --   6-7/10   Pain Location Shoulder    Pain Orientation Left    Aggravating Factors  reaching bwd, reaching overhead, lifting, activity    Pain Relieving Factors inactivity    Effect of Pain on Daily Activities limits ADLs/IADLs including bathing,  dressing, and household chores                               [facility] Adult PT Treatment/Exercise - 05/22/21 0001       Exercises   Exercises Shoulder   Reviewed pt presentation, pain levels, home management strategies, and current function.  Ther ex performed in order to improve pain, ROM, and function.     Shoulder Exercises: Supine   Other Supine Exercises supine wand ER 2x10 reps      Shoulder Exercises: Seated   Other Seated Exercises seated UT stretch 3x20 seconds.  seated wand ER AAROM x 10 reps    Other Seated Exercises Pulleys in flex and scaption x 15 reps each      Shoulder Exercises: Standing   Other Standing Exercises standing hz add with wand 2x10 reps.      Manual Therapy   Manual Therapy Soft tissue mobilization;Passive ROM;Joint mobilization;Myofascial release    Joint Mobilization Joint distraction with oscillation and Grade 2 AP, PA, and Inf jt mobs to L GH jt to improve ROM, stiffness, pain, and to normalize arthrokinematics    Soft tissue mobilization STM to L UT, medial scap mm, and post delt and post cuff to  improve soft tissue tightness and pain.  Gentle trigger pt release to L UT.    Passive ROM f/b L shoulder PROM in flex, abd, ER, and IR in supine per jt stiffness and pt tolerance.                    PT Education - 05/22/21 2056     Education Details Educated pt in dx including stages of adhesive capsulitis and what to expect. Educated pt in relevant anatomy.  Instructed pt in correct form with exercises.  Instructed pt to cont with HEP.    Person(s) Educated Patient    Methods Explanation;Demonstration;Tactile cues;Verbal cues    Comprehension Returned demonstration;Verbalized understanding              PT Short Term Goals - 05/14/21 1434       PT SHORT TERM GOAL #1   Title Pt will be independent and compliant with HEP for improved pain, ROM, strength, and function.    Time 3    Period Weeks    Status New     Target Date 06/04/21      PT SHORT TERM GOAL #2   Title Pt will report at least a 25% improvement with shoulder mobility with ADLs and reaching.    Time 3    Period Weeks    Status New    Target Date 06/04/21      PT SHORT TERM GOAL #3   Title Pt will report she is able to drive without significant pain.    Time 3    Period Weeks    Status New    Target Date 06/04/21      PT SHORT TERM GOAL #4   Title Pt will demonstrate improved L shoulder AROM to at least 120 deg in flexion, 115 deg in scaption, 60 deg in ER, and 55 deg in IR for improved shoulder mobility and performance of ADLs.    Baseline L shoulder AROM: flex:  89 deg,  scaption:  81 deg, ER:  50 deg, IR:  43 deg    Time 3    Period Weeks    Status New    Target Date 06/04/21      PT SHORT TERM GOAL #5   Title Pt will be able to apply deodorant with no > than minimal difficulty.    Time 3    Period Weeks    Status New    Target Date 06/04/21               PT Long Term Goals - 05/14/21 1440       PT LONG TERM GOAL #1   Title Pt will be able to reach overhead into a cabinet to grab a cup or plate without significant pain.    Time 6    Period Weeks    Status New    Target Date 06/25/21      PT LONG TERM GOAL #2   Title Pt will be able to perform bathing and dressing without difficulty and signifiant pain.    Time 6    Period Weeks    Status New    Target Date 06/25/21      PT LONG TERM GOAL #3   Title Pt will demo L shoulder AROM to be at least 150 deg in flexion, 120 deg in abd, 140 deg in scaption, and 65-70 deg in ER for improved stiffness and performance of ADLs/IADLs.    Baseline L  shoulder AROM: flex: 89 deg, scaption: 81 deg, ER: 50 deg, IR: 43 deg    Time 6    Period Weeks    Status New    Target Date 06/25/21      PT LONG TERM GOAL #4   Title Pt will report she is able to perform her household chores and normal reaching and overhead activities without significant pain or difficulty.     Time 6    Period Weeks    Status New    Target Date 06/25/21      PT LONG TERM GOAL #5   Title Pt will demo improved L shoulder including RTC strength to at least 4+5 MMT t/o for improved lifting and performance of work activities.    Time 6    Period Weeks    Status New    Target Date 06/25/21                   Plan - 05/22/21 2059     Clinical Impression Statement Pt responded better to prior Rx having improved sx's for 24 hrs.  She continues to have increased pain with HEP.  PT instructed pt to not perform exercises into a painful range though needed to continue with HEP.  Pt continues to be very tight and limited with L shoulder ROM t/o.  Pt had improved joint mobility with Jt mobs today.  She has pain with ER PROM though has decent ROM.  Pt was very limited in flex, abd, and IR PROM.  Pt has tightness in L UT and post shoulder mm.  Pt reports improved tightness after soft tissue work.  She continues to be limited with ADL/IADLs, reaching act's, and overhead activities.  Pt responded well to Rx reporting no increased pain after Rx.  Pt should benefit from cont skilled PT services to address goals, improve ROM, and to restore PLOF.    Comorbidities DM    PT Treatment/Interventions ADLs/Self Care Home Management;Cryotherapy;Electrical Stimulation;Ultrasound;Moist Heat;Therapeutic activities;Therapeutic exercise;Neuromuscular re-education;Manual techniques;Patient/family education;Passive range of motion;Taping    PT Next Visit Plan Cont with STM, PROM and joint mobs to L shoulder.  Cont with pulleys, ROM, and stretching.    PT Home Exercise Plan Access Code: [email or web address].  No changes made to HEP today    Consulted and Agree with Plan of Care Patient             Patient will benefit from skilled therapeutic intervention in order to improve the following deficits and impairments:  Decreased strength, Decreased range of motion, Hypomobility, Impaired UE functional use, Pain  Visit  Diagnosis: Chronic left shoulder pain  Stiffness of left shoulder, not elsewhere classified  Muscle weakness (generalized)     Problem List Patient Active Problem List   Diagnosis Date Noted   Labral tear of right hip joint 12/02/2020   Toe swelling 12/01/2018   Primary osteoarthritis of left first IP joint 11/08/2018   History of cholecystectomy 09/05/2018   Travel advice encounter 07/25/2018   Right foot pain 04/11/2018   Impingement syndrome of both shoulders 01/04/2018   Ovarian cyst 07/21/2017   Bruxism 04/28/2016   Incomplete bladder emptying 07/31/2014   Hypothyroidism 07/24/2014   Diabetes mellitus, type 2 ([place]) 03/07/2014   Hypertension 12/20/2013   Insomnia 12/20/2013   Postlaminectomy syndrome 11/13/2013   GERD (gastroesophageal reflux disease) 10/23/2013   Annual physical exam 10/23/2013   Bilateral plantar fasciitis 10/03/2013   HLD (hyperlipidemia) 09/11/2013   Lung nodule, solitary 08/24/2012  Otoporosis 08/12/2010   Primary papillary carcinoma of thyroid, follicular subtype, post thyroidectomy 03/17/2010    Selinda Michaels III PT, DPT 05/22/21 9:10 PM   [facility] [place], [place], [address] Phone: [phone]   Fax:  [phone]  Name: Nancy Poole MRN: [id number] Date of Birth: [birth year]

## 2021-05-28 ENCOUNTER — Other Ambulatory Visit: Payer: Self-pay

## 2021-05-28 ENCOUNTER — Ambulatory Visit (HOSPITAL_BASED_OUTPATIENT_CLINIC_OR_DEPARTMENT_OTHER): Payer: No Typology Code available for payment source | Admitting: Physical Therapy

## 2021-05-28 ENCOUNTER — Encounter (HOSPITAL_BASED_OUTPATIENT_CLINIC_OR_DEPARTMENT_OTHER): Payer: Self-pay | Admitting: Physical Therapy

## 2021-05-28 DIAGNOSIS — G8929 Other chronic pain: Secondary | ICD-10-CM | POA: Diagnosis not present

## 2021-05-28 DIAGNOSIS — M25612 Stiffness of left shoulder, not elsewhere classified: Secondary | ICD-10-CM

## 2021-05-28 DIAGNOSIS — M6281 Muscle weakness (generalized): Secondary | ICD-10-CM

## 2021-05-28 NOTE — Therapy (Signed)
[place], [place], [address] Phone: [phone]   Fax:  [phone]  Physical Therapy Treatment  Patient Details  Name: Nancy Poole MRN: [id number] Date of Birth: [birth year] Referring Provider (PT): Dr. Lilia Argue   Encounter Date: 05/28/2021   PT End of Session - 05/28/21 2122     Visit Number 5    Number of Visits 12    Date for PT Re-Evaluation 06/25/21    Authorization Type [employer]na/[place] Preferred    PT Start Time 0857    PT Stop Time 0942    PT Time Calculation (min) 45 min    Activity Tolerance Patient tolerated treatment well    Behavior During Therapy [facility] for tasks assessed/performed             Past Medical History:  Diagnosis Date   Cancer ([place])    Diabetes mellitus without complication ([place])    Herniated disc    Hypertension     Past Surgical History:  Procedure Laterality Date   THYROIDECTOMY      There were no vitals filed for this visit.   Subjective Assessment - 05/28/21 0900     Subjective Pt denies any functional improvements and denies any improvement in pain and tightness.  Pt had a back injection last Friday and hasn't noticed any relief from that yet.  Pt states she has had to work a lot since then.  Pt reports compliance with HEP though still feels that HEP irritates shoulder.  She has difficulty sleeping.    Pertinent History DM, Hx of lumbar pain/radiculopathy with lumbar microdiscectomy in 2014 and post laminectomy syndrome, thyroid CA and had thyroid removed in 2011.    Patient Stated Goals To have full ROM and full strength.  to be able to dress and apply deodorant.  improved function.    Currently in Pain? Yes    Pain Score 6     Pain Location Shoulder    Pain Orientation Left    Pain Descriptors / Indicators Constant                [facility] PT Assessment - 05/28/21 0001       AROM   AROM Assessment Site Shoulder   Scaption:  78/103 deg  before/after STM   Right/Left Shoulder Left    Left Shoulder Flexion --   88/100 deg before/after STM   Left Shoulder ABduction --   70/80 deg before/after STM   Left Shoulder Internal Rotation 48 Degrees    Left Shoulder External Rotation 54 Degrees      PROM   Left Shoulder External Rotation 62 Degrees                           [facility] Adult PT Treatment/Exercise - 05/28/21 0001       Exercises   Exercises Shoulder   Reviewed response to prior Rx, current function, pain level, and HEP compliance.  Assessed ROM.     Shoulder Exercises: Seated   Other Seated Exercises seated UT stretch 3x20 seconds.  seated wand ER AAROM x 10 reps    Other Seated Exercises Pulleys in flex and scaption 2 x 10 reps each      Shoulder Exercises: Stretch   Other Shoulder Stretches modified sleeper stretch 3x20 sec, seated post capsule stretch 3x20 sec      Manual Therapy   Manual Therapy Soft tissue mobilization;Passive ROM;Joint mobilization;Myofascial release  Joint Mobilization Joint distraction with oscillation and Grade 2 AP, PA, and Inf jt mobs to L GH jt to improve ROM, stiffness, pain, and to normalize arthrokinematics    Soft tissue mobilization STM to L UT, medial scap mm, and post delt and post cuff to improve soft tissue tightness and pain.  Gentle trigger pt release to L UT.    Passive ROM f/b L shoulder PROM in flex, abd, ER, and IR in supine per jt stiffness and pt tolerance.                    PT Education - 05/28/21 0954     Education Details Educated pt in dx and answered Pt's questions.  Educated pt in objective findings and how it compares to IE and before/after MT today.  Reviewed and performed HEP.  Updated HEP and gave pt a HEP handout.  Educated pt in correct form and appropriate frequency.  Instructed pt to not perform into a painful range.  Access Code: [email or web address]  URL: [email or web address]  Date: 05/28/2021  Prepared by: Ronny Flurry     Exercises  Seated Upper Trapezius Stretch - 2 x daily - 7 x weekly - 1 sets - 2 reps - 20-30 seconds hold  Standing Shoulder Posterior Capsule Stretch - 2 x daily - 7 x weekly - 1 sets - 2-3 reps - 20 seconds hold.    Person(s) Educated Patient    Methods Explanation;Demonstration;Tactile cues;Verbal cues;Handout    Comprehension Returned demonstration;Verbalized understanding              PT Short Term Goals - 05/14/21 1434       PT SHORT TERM GOAL #1   Title Pt will be independent and compliant with HEP for improved pain, ROM, strength, and function.    Time 3    Period Weeks    Status New    Target Date 06/04/21      PT SHORT TERM GOAL #2   Title Pt will report at least a 25% improvement with shoulder mobility with ADLs and reaching.    Time 3    Period Weeks    Status New    Target Date 06/04/21      PT SHORT TERM GOAL #3   Title Pt will report she is able to drive without significant pain.    Time 3    Period Weeks    Status New    Target Date 06/04/21      PT SHORT TERM GOAL #4   Title Pt will demonstrate improved L shoulder AROM to at least 120 deg in flexion, 115 deg in scaption, 60 deg in ER, and 55 deg in IR for improved shoulder mobility and performance of ADLs.    Baseline L shoulder AROM: flex:  89 deg,  scaption:  81 deg, ER:  50 deg, IR:  43 deg    Time 3    Period Weeks    Status New    Target Date 06/04/21      PT SHORT TERM GOAL #5   Title Pt will be able to apply deodorant with no > than minimal difficulty.    Time 3    Period Weeks    Status New    Target Date 06/04/21               PT Long Term Goals - 05/14/21 1440       PT LONG TERM GOAL #1   Title Pt will  be able to reach overhead into a cabinet to grab a cup or plate without significant pain.    Time 6    Period Weeks    Status New    Target Date 06/25/21      PT LONG TERM GOAL #2   Title Pt will be able to perform bathing and dressing without difficulty and signifiant pain.     Time 6    Period Weeks    Status New    Target Date 06/25/21      PT LONG TERM GOAL #3   Title Pt will demo L shoulder AROM to be at least 150 deg in flexion, 120 deg in abd, 140 deg in scaption, and 65-70 deg in ER for improved stiffness and performance of ADLs/IADLs.    Baseline L shoulder AROM: flex: 89 deg, scaption: 81 deg, ER: 50 deg, IR: 43 deg    Time 6    Period Weeks    Status New    Target Date 06/25/21      PT LONG TERM GOAL #4   Title Pt will report she is able to perform her household chores and normal reaching and overhead activities without significant pain or difficulty.    Time 6    Period Weeks    Status New    Target Date 06/25/21      PT LONG TERM GOAL #5   Title Pt will demo improved L shoulder including RTC strength to at least 4+5 MMT t/o for improved lifting and performance of work activities.    Time 6    Period Weeks    Status New    Target Date 06/25/21                   Plan - 05/28/21 6712     Clinical Impression Statement Pt presents to Rx with continued reports pain and tightness in shoulder and no improvement in pain, sx's, or function.  PT measured AROM at the start of Rx and pt's L shoulder elevation AROM was worse than initial eval.  Pt has soft tissue tightness in bilat UT which was worse today than prior.  Pt reports improved tightness and shoulder mobility after STM to L medial scap mm, UT, post cuff/delt.  She also demonstrated a good improvement in shoulder elevation AROM after STM.  Pt did demo improved ER PROM today.  Pt is currently not making much progress with pain, sx's, and reported function, but may see some improvement with continued STW and progressing exercises.  Pt responded well to Rx reporting improved tightness and shoulder mobility after Rx.    Comorbidities DM    PT Treatment/Interventions ADLs/Self Care Home Management;Cryotherapy;Electrical Stimulation;Ultrasound;Moist Heat;Therapeutic activities;Therapeutic  exercise;Neuromuscular re-education;Manual techniques;Patient/family education;Passive range of motion;Taping    PT Next Visit Plan Cont with STM, PROM and joint mobs to L shoulder.  Focus more on STW and MFR as needed.  Progress stretching and ROM exercises.    PT Home Exercise Plan Access Code: [email or web address]  URL: [email or web address]  Date: 05/28/2021  Prepared by: Ronny Flurry    Exercises  Supine Shoulder External Rotation in 45 Degrees Abduction AAROM with [place] - 2 x daily - 7 x weekly - 1 sets - 10 reps  Standing Bilateral Shoulder Internal Rotation AAROM with Dowel - 2 x daily - 7 x weekly - 2 sets - 10 reps  Seated Shoulder Flexion Towel Slide at Table Top - 2 x daily - 7 x weekly - 2 sets -  10 reps  Seated Shoulder External Rotation AAROM with Dowel - 2 x daily - 7 x weekly - 1-2 sets - 10 reps  Seated Upper Trapezius Stretch - 2 x daily - 7 x weekly - 1 sets - 2 reps - 20-30 seconds hold  Standing Shoulder Posterior Capsule Stretch - 2 x daily - 7 x weekly - 1 sets - 2-3 reps - 20 seconds hold    Consulted and Agree with Plan of Care Patient             Patient will benefit from skilled therapeutic intervention in order to improve the following deficits and impairments:  Decreased strength, Decreased range of motion, Hypomobility, Impaired UE functional use, Pain  Visit Diagnosis: Chronic left shoulder pain  Stiffness of left shoulder, not elsewhere classified  Muscle weakness (generalized)     Problem List Patient Active Problem List   Diagnosis Date Noted   Labral tear of right hip joint 12/02/2020   Toe swelling 12/01/2018   Primary osteoarthritis of left first IP joint 11/08/2018   History of cholecystectomy 09/05/2018   Travel advice encounter 07/25/2018   Right foot pain 04/11/2018   Impingement syndrome of both shoulders 01/04/2018   Ovarian cyst 07/21/2017   Bruxism 04/28/2016   Incomplete bladder emptying 07/31/2014   Hypothyroidism 07/24/2014    Diabetes mellitus, type 2 ([place]) 03/07/2014   Hypertension 12/20/2013   Insomnia 12/20/2013   Postlaminectomy syndrome 11/13/2013   GERD (gastroesophageal reflux disease) 10/23/2013   Annual physical exam 10/23/2013   Bilateral plantar fasciitis 10/03/2013   HLD (hyperlipidemia) 09/11/2013   Lung nodule, solitary 08/24/2012   Otoporosis 08/12/2010   Primary papillary carcinoma of thyroid, follicular subtype, post thyroidectomy 03/17/2010    Selinda Michaels III PT, DPT 05/28/21 9:35 PM   [facility] [address] [place], [place], [address] Phone: [phone]   Fax:  [phone]  Name: Nancy Poole MRN: [id number] Date of Birth: 06/20/1966

## 2021-05-29 ENCOUNTER — Encounter (HOSPITAL_BASED_OUTPATIENT_CLINIC_OR_DEPARTMENT_OTHER): Payer: Self-pay | Admitting: Physical Therapy

## 2021-05-29 ENCOUNTER — Ambulatory Visit: Payer: No Typology Code available for payment source | Admitting: Sports Medicine

## 2021-05-29 ENCOUNTER — Ambulatory Visit (HOSPITAL_BASED_OUTPATIENT_CLINIC_OR_DEPARTMENT_OTHER): Payer: No Typology Code available for payment source | Admitting: Physical Therapy

## 2021-05-29 DIAGNOSIS — G8929 Other chronic pain: Secondary | ICD-10-CM

## 2021-05-29 DIAGNOSIS — M25512 Pain in left shoulder: Secondary | ICD-10-CM

## 2021-05-29 DIAGNOSIS — M6281 Muscle weakness (generalized): Secondary | ICD-10-CM

## 2021-05-29 DIAGNOSIS — M25612 Stiffness of left shoulder, not elsewhere classified: Secondary | ICD-10-CM

## 2021-05-29 NOTE — Therapy (Addendum)
[place], [place], [address] Phone: [phone]   Fax:  [phone]  Physical Therapy Treatment  Patient Details  Name: Nancy Poole MRN: [id number] Date of Birth: Nov 29, 1965 Referring Provider (PT): Dr. Lilia Argue   Encounter Date: 05/29/2021   PT End of Session - 05/29/21 1154     Visit Number 6    Number of Visits 12    Authorization Type Aetna/[place] Preferred    PT Start Time 1152    PT Stop Time 1236    PT Time Calculation (min) 44 min    Activity Tolerance Patient tolerated treatment well    Behavior During Therapy [facility] for tasks assessed/performed             Past Medical History:  Diagnosis Date   Cancer ([place])    Diabetes mellitus without complication ([place])    Herniated disc    Hypertension     Past Surgical History:  Procedure Laterality Date   THYROIDECTOMY      There were no vitals filed for this visit.   Subjective Assessment - 05/29/21 1153     Subjective Pt denies any functional improvements and denies any improvement in pain and tightness.  Pt had a back injection last Friday and hasn't noticed any relief from that yet.  She may call MD due to the back injection not helping at all and actually feeling worse.  Pt states she has had to work a lot since then.  Pt reports compliance with HEP though still feels that HEP irritates shoulder.  She has difficulty sleeping including last night.  Pt states she didn't have to go to work after yesterday's appt and may have felt a little better after Rx.    Pertinent History DM, Hx of lumbar pain/radiculopathy with lumbar microdiscectomy in 2014 and post laminectomy syndrome, thyroid CA and had thyroid removed in 2011.    Patient Stated Goals To have full ROM and full strength.  to be able to dress and apply deodorant.  improved function.    Pain Score 7     Pain Location Shoulder    Pain Orientation Left                 [facility] PT Assessment - 05/29/21 0001       AROM   AROM Assessment Site Shoulder   scaption:  88 deg   Right/Left Shoulder Left    Left Shoulder Flexion --   97 deg   Left Shoulder ABduction --   77                          [facility] Adult PT Treatment/Exercise - 05/29/21 0001       Exercises   Exercises Shoulder   Reviewed response to prior Rx, current function, pain level, and HEP compliance.  Assessed ROM.     Shoulder Exercises: Seated   Other Seated Exercises seated UT stretch 3x20 seconds.  seated shoulder flexion AAROM with p-ball x 10 reps.    Other Seated Exercises Pulleys in flex and scaption 2 x 10 reps each      Shoulder Exercises: Standing   Other Standing Exercises anterior capsule stretch in doorway 3x10 seconds      Shoulder Exercises: Stretch   Other Shoulder Stretches modified sleeper stretch 3x20 sec, seated post capsule stretch 3x20 sec      Manual Therapy   Manual Therapy Soft tissue mobilization;Passive ROM;Joint mobilization;Myofascial  release    Joint Mobilization Joint distraction with oscillation and Grade 2 AP, PA, and Inf jt mobs to L GH jt to improve ROM, stiffness, pain, and to normalize arthrokinematics    Soft tissue mobilization STM to L UT, medial scap mm, and post delt and post cuff to improve soft tissue tightness and pain. trigger pt release to L UT.    Passive ROM f/b L shoulder PROM in flex, abd, ER, and IR in supine per jt stiffness and pt tolerance.                    PT Education - 05/29/21 1520     Education Details Answered Pt's questions.  Educated pt in objective findings of ROM.  instructed pt to cont with HEP. Instruction in correct form and positioning with exercises.    Person(s) Educated Patient    Methods Explanation;Demonstration;Verbal cues    Comprehension Verbalized understanding;Returned demonstration              PT Short Term Goals - 05/14/21 1434       PT SHORT TERM GOAL #1   Title Pt  will be independent and compliant with HEP for improved pain, ROM, strength, and function.    Time 3    Period Weeks    Status New    Target Date 06/04/21      PT SHORT TERM GOAL #2   Title Pt will report at least a 25% improvement with shoulder mobility with ADLs and reaching.    Time 3    Period Weeks    Status New    Target Date 06/04/21      PT SHORT TERM GOAL #3   Title Pt will report she is able to drive without significant pain.    Time 3    Period Weeks    Status New    Target Date 06/04/21      PT SHORT TERM GOAL #4   Title Pt will demonstrate improved L shoulder AROM to at least 120 deg in flexion, 115 deg in scaption, 60 deg in ER, and 55 deg in IR for improved shoulder mobility and performance of ADLs.    Baseline L shoulder AROM: flex:  89 deg,  scaption:  81 deg, ER:  50 deg, IR:  43 deg    Time 3    Period Weeks    Status New    Target Date 06/04/21      PT SHORT TERM GOAL #5   Title Pt will be able to apply deodorant with no > than minimal difficulty.    Time 3    Period Weeks    Status New    Target Date 06/04/21               PT Long Term Goals - 05/14/21 1440       PT LONG TERM GOAL #1   Title Pt will be able to reach overhead into a cabinet to grab a cup or plate without significant pain.    Time 6    Period Weeks    Status New    Target Date 06/25/21      PT LONG TERM GOAL #2   Title Pt will be able to perform bathing and dressing without difficulty and signifiant pain.    Time 6    Period Weeks    Status New    Target Date 06/25/21      PT LONG TERM GOAL #3  Title Pt will demo L shoulder AROM to be at least 150 deg in flexion, 120 deg in abd, 140 deg in scaption, and 65-70 deg in ER for improved stiffness and performance of ADLs/IADLs.    Baseline L shoulder AROM: flex: 89 deg, scaption: 81 deg, ER: 50 deg, IR: 43 deg    Time 6    Period Weeks    Status New    Target Date 06/25/21      PT LONG TERM GOAL #4   Title Pt will  report she is able to perform her household chores and normal reaching and overhead activities without significant pain or difficulty.    Time 6    Period Weeks    Status New    Target Date 06/25/21      PT LONG TERM GOAL #5   Title Pt will demo improved L shoulder including RTC strength to at least 4+5 MMT t/o for improved lifting and performance of work activities.    Time 6    Period Weeks    Status New    Target Date 06/25/21                   Plan - 05/29/21 1522     Clinical Impression Statement Pt reports no improvement in function, sx's, or pain.  She did report feeling a little better after prior Rx, though didn't have to go to work yesterday either.  Pt continues to have difficulty performing ADLs/IADLs due to pain and tightness.  She has difficulty sleeping. She is very limited with reaching and is unable to reach overhead. PT assessed ROM after MT today and Pt's ROM was worse than yesterday's measurements after STW though better than the start of yesterday's Rx.  She continues to have tightness and limited ROM t/o L shoulder.  Pt has soft tissue tightness with mm spasm in L UT.  Pt is limited with performing ant capsule stretch in doorway and has pain.  Pt responded well to Rx having no c/o's after Rx.    Comorbidities DM    PT Treatment/Interventions ADLs/Self Care Home Management;Cryotherapy;Electrical Stimulation;Ultrasound;Moist Heat;Therapeutic activities;Therapeutic exercise;Neuromuscular re-education;Manual techniques;Patient/family education;Passive range of motion;Taping    PT Next Visit Plan Cont with STM, PROM and joint mobs to L shoulder.  Focus more on STW and MFR as needed.  Progress stretching and ROM exercises.  pt will be out of town next week and will return to PT after next week.    PT Home Exercise Plan Access Code: [email or web address]  URL: [email or web address]             Patient will benefit from skilled therapeutic intervention in order to  improve the following deficits and impairments:  Decreased strength, Decreased range of motion, Hypomobility, Impaired UE functional use, Pain  Visit Diagnosis: Chronic left shoulder pain  Stiffness of left shoulder, not elsewhere classified  Muscle weakness (generalized)     Problem List Patient Active Problem List   Diagnosis Date Noted   Labral tear of right hip joint 12/02/2020   Toe swelling 12/01/2018   Primary osteoarthritis of left first IP joint 11/08/2018   History of cholecystectomy 09/05/2018   Travel advice encounter 07/25/2018   Right foot pain 04/11/2018   Impingement syndrome of both shoulders 01/04/2018   Ovarian cyst 07/21/2017   Bruxism 04/28/2016   Incomplete bladder emptying 07/31/2014   Hypothyroidism 07/24/2014   Diabetes mellitus, type 2 ([place]) 03/07/2014   Hypertension 12/20/2013   Insomnia 12/20/2013   Postlaminectomy  syndrome 11/13/2013   GERD (gastroesophageal reflux disease) 10/23/2013   Annual physical exam 10/23/2013   Bilateral plantar fasciitis 10/03/2013   HLD (hyperlipidemia) 09/11/2013   Lung nodule, solitary 08/24/2012   Otoporosis 08/12/2010   Primary papillary carcinoma of thyroid, follicular subtype, post thyroidectomy 03/17/2010    Selinda Michaels III PT, DPT 05/29/21 3:31 PM  PHYSICAL THERAPY DISCHARGE SUMMARY  Visits from Start of Care: 6  Current functional level related to goals / functional outcomes: Unable to assess current functional status or goals due to pt not present at discharge.    Remaining deficits: Unable to assess current functional status or goals due to pt not present at discharge.   Education / Equipment: See above   Pt was going out of town the following week after her last appt.  PT did not schedule any further PT after her last appt and will be considered discharged at this time.  Pt has a HEP.  Selinda Michaels III PT, DPT 10/24/21 11:39 AM   [facility] [place], [place], [address] Phone: [phone]   Fax:  [phone]  Name: Nancy Poole MRN: [id number] Date of Birth: 11/08/1966

## 2021-06-11 ENCOUNTER — Other Ambulatory Visit: Payer: Self-pay

## 2021-06-11 ENCOUNTER — Encounter (HOSPITAL_BASED_OUTPATIENT_CLINIC_OR_DEPARTMENT_OTHER): Payer: Self-pay | Admitting: Emergency Medicine

## 2021-06-11 DIAGNOSIS — I10 Essential (primary) hypertension: Secondary | ICD-10-CM | POA: Insufficient documentation

## 2021-06-11 DIAGNOSIS — S61210A Laceration without foreign body of right index finger without damage to nail, initial encounter: Secondary | ICD-10-CM | POA: Insufficient documentation

## 2021-06-11 DIAGNOSIS — X58XXXA Exposure to other specified factors, initial encounter: Secondary | ICD-10-CM | POA: Diagnosis not present

## 2021-06-11 DIAGNOSIS — Z23 Encounter for immunization: Secondary | ICD-10-CM | POA: Diagnosis not present

## 2021-06-11 DIAGNOSIS — E119 Type 2 diabetes mellitus without complications: Secondary | ICD-10-CM | POA: Diagnosis not present

## 2021-06-11 DIAGNOSIS — Z85850 Personal history of malignant neoplasm of thyroid: Secondary | ICD-10-CM | POA: Diagnosis not present

## 2021-06-11 DIAGNOSIS — E039 Hypothyroidism, unspecified: Secondary | ICD-10-CM | POA: Insufficient documentation

## 2021-06-11 MED ORDER — TETANUS-DIPHTH-ACELL PERTUSSIS 5-2.5-18.5 LF-MCG/0.5 IM SUSY
0.5000 mL | PREFILLED_SYRINGE | Freq: Once | INTRAMUSCULAR | Status: AC
  Administered 2021-06-12: 0.5 mL via INTRAMUSCULAR
  Filled 2021-06-11: qty 0.5

## 2021-06-11 NOTE — ED Triage Notes (Signed)
Pt presents to ED POV. Pt c/o R pointer finger. Bleeding controlled. Pt not UTD on tetanus.

## 2021-06-12 ENCOUNTER — Emergency Department (HOSPITAL_BASED_OUTPATIENT_CLINIC_OR_DEPARTMENT_OTHER)
Admission: EM | Admit: 2021-06-12 | Discharge: 2021-06-12 | Disposition: A | Discharge status: Home / Self Care | Payer: Worker's Compensation | Attending: Emergency Medicine | Admitting: Emergency Medicine

## 2021-06-12 ENCOUNTER — Encounter (HOSPITAL_BASED_OUTPATIENT_CLINIC_OR_DEPARTMENT_OTHER): Payer: Self-pay | Admitting: Emergency Medicine

## 2021-06-12 DIAGNOSIS — S61219A Laceration without foreign body of unspecified finger without damage to nail, initial encounter: Secondary | ICD-10-CM

## 2021-06-12 MED ORDER — LIDOCAINE VISCOUS HCL 2 % MT SOLN
15.0000 mL | Freq: Once | OROMUCOSAL | Status: AC
  Administered 2021-06-12: 15 mL via OROMUCOSAL
  Filled 2021-06-12: qty 15

## 2021-06-12 MED ORDER — LIDOCAINE HCL URETHRAL/MUCOSAL 2 % EX GEL: 1.0000 "application " | CUTANEOUS | 0 refills | Status: DC | PRN

## 2021-06-12 MED ORDER — MUPIROCIN CALCIUM 2 % EX CREA: 1.0000 "application " | TOPICAL_CREAM | Freq: Two times a day (BID) | CUTANEOUS | 0 refills | Status: DC

## 2021-06-12 NOTE — ED Provider Notes (Addendum)
[facility] EMERGENCY DEPT Provider Note   CSN: [id number] Arrival date & time: 06/11/21  2128     History Chief Complaint  Patient presents with   Finger Injury    Nancy Poole is a 55 y.o. female.  The history is provided by the patient. No language interpreter was used.  Laceration Location:  Finger Finger laceration location:  R index finger Depth:  Through dermis Quality: avulsion   Quality comment:  78m tear shaped wound Laceration mechanism:  Metal edge (dry erase board) Pain details:    Quality:  Aching   Severity:  Severe   Timing:  Constant Foreign body present:  No foreign bodies Relieved by:  Nothing Worsened by:  Nothing Ineffective treatments:  None tried Tetanus status:  Out of date Associated symptoms: no fever and no numbness       Past Medical History:  Diagnosis Date   [place]    Diabetes mellitus without complication ([place]    Herniated disc    Hypertension     Patient Active Problem List   Diagnosis Date Noted   Labral tear of right hip joint 12/02/2020   Toe swelling 12/01/2018   Primary osteoarthritis of left first IP joint 11/08/2018   History of cholecystectomy 09/05/2018   Travel advice encounter 07/25/2018   Right foot pain 04/11/2018   Impingement syndrome of both shoulders 01/04/2018   Ovarian cyst 07/21/2017   Bruxism 04/28/2016   Incomplete bladder emptying 07/31/2014   Hypothyroidism 07/24/2014   Diabetes mellitus, type 2 ([place] 03/07/2014   Hypertension 12/20/2013   Insomnia 12/20/2013   Postlaminectomy syndrome 11/13/2013   GERD (gastroesophageal reflux disease) 10/23/2013   Annual physical exam 10/23/2013   Bilateral plantar fasciitis 10/03/2013   HLD (hyperlipidemia) 09/11/2013   Lung nodule, solitary 08/24/2012   Otoporosis 08/12/2010   Primary papillary carcinoma of thyroid, follicular subtype, post thyroidectomy 03/17/2010    Past Surgical History:  Procedure Laterality Date   THYROIDECTOMY        OB History   No obstetric history on file.     Family History  Problem Relation Age of Onset   Breast cancer Maternal Aunt     Social History   Tobacco Use   Smoking status: Never   Smokeless tobacco: Never  Substance Use Topics   Alcohol use: No   Drug use: No    Home Medications Prior to Admission medications   Medication Sig Start Date End Date Taking? Authorizing Provider  ACCU-CHEK GUIDE test strip USE TO TEST UP TO THREE TIMES DAILY WITH MEALS AND AT BEDTIME 03/22/18   TSilverio Decamp MD  Calcium Carb-Cholecalciferol (CALCIUM 1000 + D PO) Take by mouth.    [provider]  diazepam (VALIUM) 5 MG tablet Take 1 tablet (5 mg total) by mouth every 8 (eight) hours as needed. Patient not taking: Reported on 05/14/2021 07/25/18   TSilverio Decamp MD  dicyclomine (BENTYL) 10 MG capsule Take 10 mg by mouth 3 (three) times daily as needed. 02/06/20   [provider]  fluticasone (FLONASE) 50 MCG/ACT nasal spray Two spray in each nostril twice a day, use left hand for right nostril, and right hand for left nostril. 09/22/19   TSilverio Decamp MD  Gabapentin, Once-Daily, (GRALISE) 600 MG TABS Take 600 mg by mouth.    [provider]  HYDROcodone-acetaminophen (NORCO) 10-325 MG tablet Take 1 tablet by mouth as directed. 12/27/17   [provider]  levothyroxine (SYNTHROID) 112 MCG tablet Take  1 tablet (112 mcg total) by mouth daily before breakfast. TAKE 1 TABLET DAILY BEFORE BREAKFAST 11/17/19   Silverio Decamp, MD  NONFORMULARY OR COMPOUNDED ITEM Lancets, strips and 1 glucometer, for testing up to 3 times a day with meals and at HS.  DX= E.11.9 02/11/17   Silverio Decamp, MD  omeprazole (PRILOSEC) 40 MG capsule Take 1 capsule (40 mg total) by mouth daily. 12/02/20   Silverio Decamp, MD  Semaglutide, 1 MG/DOSE, (OZEMPIC, 1 MG/DOSE,) 2 MG/1.5ML SOPN Inject 1 mg into the skin once a week. 12/02/20   Silverio Decamp, MD  topiramate (TOPAMAX) 100 MG tablet Take 1 tablet (100 mg total) by mouth 2 (two) times daily. 12/02/20   Silverio Decamp, MD  traZODone (DESYREL) 100 MG tablet Take 1 tablet (100 mg total) by mouth at bedtime. 12/02/20   Silverio Decamp, MD    Allergies    Mangifera indica, Pregabalin, and Bee venom  Review of Systems   Review of Systems  Constitutional:  Negative for fever.  HENT:  Negative for facial swelling.   Eyes:  Negative for redness.  Respiratory:  Negative for wheezing and stridor.   Cardiovascular:  Negative for palpitations.  Gastrointestinal:  Negative for nausea and vomiting.  Genitourinary:  Negative for difficulty urinating.  Musculoskeletal:  Negative for neck stiffness.  Skin:  Positive for wound.  Neurological:  Negative for facial asymmetry.  Psychiatric/Behavioral:  Negative for agitation.   All other systems reviewed and are negative.  Physical Exam Updated Vital Signs BP (!) 146/88 (BP Location: Right Arm)   Pulse 63   Temp 98.4 F (36.9 C) (Oral)   Resp 16   Ht '5\' 7"'$  (1.702 m)   Wt 58.1 kg   SpO2 98%   BMI 20.05 kg/m   Physical Exam Vitals and nursing note reviewed.  Constitutional:      General: She is not in acute distress.    Appearance: Normal appearance.  HENT:     Head: Normocephalic and atraumatic.     Nose: Nose normal.  Eyes:     Conjunctiva/sclera: Conjunctivae normal.     Pupils: Pupils are equal, round, and reactive to light.  Cardiovascular:     Rate and Rhythm: Normal rate and regular rhythm.     Pulses: Normal pulses.     Heart sounds: Normal heart sounds.  Pulmonary:     Effort: Pulmonary effort is normal.     Breath sounds: Normal breath sounds.  Abdominal:     General: Abdomen is flat. Bowel sounds are normal.     Palpations: Abdomen is soft.     Tenderness: There is no abdominal tenderness. There is no guarding.  Musculoskeletal:        General: Normal range of motion.       Arms:      Cervical back: Normal range of motion and neck supple.  Skin:    General: Skin is warm and dry.     Capillary Refill: Capillary refill takes less than 2 seconds.  Neurological:     General: No focal deficit present.     Mental Status: She is alert and oriented to person, place, and time.     Deep Tendon Reflexes: Reflexes normal.  Psychiatric:        Mood and Affect: Mood normal.        Behavior: Behavior normal.    ED Results / Procedures / Treatments   Labs (all labs ordered are listed,  but only abnormal results are displayed) Labs Reviewed - No data to display  EKG None  Radiology No results found.  Procedures Procedures   Medications Ordered in ED Medications  Tdap (BOOSTRIX) injection 0.5 mL (has no administration in time range)  lidocaine (XYLOCAINE) 2 % viscous mouth solution 15 mL (has no administration in time range)    ED Course  I have reviewed the triage vital signs and the nursing notes.  Pertinent labs & imaging results that were available during my care of the patient were reviewed by me and considered in my medical decision making (see chart for details).  Nothing to suture as skin is missing.  Scab has formed.  Cannot be closed.  Wound was cleansed and bandaged thoroughly in the ED.  Thorough wound care instructions given verbally to the patient with nurse present. All questions answered to patient's satisfaction with nurse present.  Supplies given in the ED.  The patient wants strong pain medication.  I suspect some degree of drug seeking.  Patient is already on oral narcotics per the [place].     Wound care instructions given.  Clean daily.  Do not submerge in any bodies of water for 2 weeks (pools, tubs, oceans, lakes etc).    Nancy Poole was evaluated in Emergency Department on 06/12/2021 for the symptoms described in the history of present illness. She was evaluated in the context of the global COVID-19 pandemic, which necessitated consideration  that the patient might be at risk for infection with the SARS-CoV-2 virus that causes COVID-19. Institutional protocols and algorithms that pertain to the evaluation of patients at risk for COVID-19 are in a state of rapid change based on information released by regulatory bodies including the CDC and federal and state organizations. These policies and algorithms were followed during the patient's care in the ED.  Final Clinical Impression(s) / ED Diagnoses Final diagnoses:  None    Return for intractable cough, coughing up blood, fevers > 100.4 unrelieved by medication, shortness of breath, intractable vomiting, chest pain, shortness of breath, weakness, numbness, changes in speech, facial asymmetry, abdominal pain, passing out, Inability to tolerate liquids or food, cough, altered mental status or any concerns. No signs of systemic illness or infection. The patient is nontoxic-appearing on exam and vital signs are within normal limits. I have reviewed the triage vital signs and the nursing notes. Pertinent labs & imaging results that were available during my care of the patient were reviewed by me and considered in my medical decision making (see chart for details). After history, exam, and medical workup I feel the patient has been appropriately medically screened and is safe for discharge home. Pertinent diagnoses were discussed with the patient. Patient was given return precautions. Rx / DC Orders ED Discharge Orders     None        Zamira Hickam, MD 06/12/21 [id number]    Randal Buba, Takeria Marquina, MD 06/12/21 0145

## 2021-06-12 NOTE — Discharge Instructions (Addendum)
Shower as you normally would Clean wound with warm soapy water Apply antibiotic ointment to wound Bandage wound Do not submerge wound in water for 14 days.  No pools, lakes, streams, ocean, sink, bath tub

## 2021-07-17 ENCOUNTER — Other Ambulatory Visit: Payer: Self-pay

## 2021-07-17 ENCOUNTER — Ambulatory Visit: Payer: No Typology Code available for payment source | Admitting: Sports Medicine

## 2021-07-17 DIAGNOSIS — G8929 Other chronic pain: Secondary | ICD-10-CM

## 2021-07-17 DIAGNOSIS — M25512 Pain in left shoulder: Secondary | ICD-10-CM

## 2021-07-22 ENCOUNTER — Ambulatory Visit: Payer: No Typology Code available for payment source | Admitting: Sports Medicine

## 2021-07-22 ENCOUNTER — Other Ambulatory Visit: Payer: Self-pay

## 2021-07-22 ENCOUNTER — Encounter: Payer: Self-pay | Admitting: Sports Medicine

## 2021-07-22 ENCOUNTER — Ambulatory Visit (INDEPENDENT_AMBULATORY_CARE_PROVIDER_SITE_OTHER): Payer: No Typology Code available for payment source

## 2021-07-22 DIAGNOSIS — M7541 Impingement syndrome of right shoulder: Secondary | ICD-10-CM | POA: Diagnosis not present

## 2021-07-22 DIAGNOSIS — Z0000 Encounter for general adult medical examination without abnormal findings: Secondary | ICD-10-CM | POA: Diagnosis not present

## 2021-07-22 DIAGNOSIS — M7502 Adhesive capsulitis of left shoulder: Secondary | ICD-10-CM

## 2021-07-22 DIAGNOSIS — E6609 Other obesity due to excess calories: Secondary | ICD-10-CM | POA: Diagnosis not present

## 2021-07-22 DIAGNOSIS — M7542 Impingement syndrome of left shoulder: Secondary | ICD-10-CM

## 2021-07-22 DIAGNOSIS — R635 Abnormal weight gain: Secondary | ICD-10-CM

## 2021-07-22 DIAGNOSIS — E119 Type 2 diabetes mellitus without complications: Secondary | ICD-10-CM | POA: Diagnosis not present

## 2021-07-22 DIAGNOSIS — H6983 Other specified disorders of Eustachian tube, bilateral: Secondary | ICD-10-CM

## 2021-07-22 DIAGNOSIS — E034 Atrophy of thyroid (acquired): Secondary | ICD-10-CM

## 2021-07-22 LAB — POCT GLYCOSYLATED HEMOGLOBIN (HGB A1C): HbA1c, POC (controlled diabetic range): 4.9 % (ref 0.0–7.0)

## 2021-07-22 MED ORDER — FLUTICASONE PROPIONATE 50 MCG/ACT NA SUSP: NASAL | 3 refills | Status: AC

## 2021-07-22 MED ORDER — OMEPRAZOLE 40 MG PO CPDR: 40.0000 mg | DELAYED_RELEASE_CAPSULE | Freq: Every day | ORAL | 3 refills | Status: DC

## 2021-07-22 MED ORDER — TOPIRAMATE 100 MG PO TABS: 100.0000 mg | ORAL_TABLET | Freq: Two times a day (BID) | ORAL | 3 refills | Status: DC

## 2021-07-22 MED ORDER — OZEMPIC (1 MG/DOSE) 2 MG/1.5ML ~~LOC~~ SOPN: 1.0000 mg | PEN_INJECTOR | SUBCUTANEOUS | 3 refills | Status: DC

## 2021-07-22 NOTE — Assessment & Plan Note (Signed)
Checking all routine labs, she is due for cervical cancer screening, last done in 2016, referral to our gynecology department here.

## 2021-07-22 NOTE — Progress Notes (Addendum)
    Procedures performed today:    Procedure: Real-time Ultrasound Guided injection/hydrodistention of the left glenohumeral joint Device: Samsung HS60  Verbal informed consent obtained.  Time-out conducted.  Noted no overlying erythema, induration, or other signs of local infection.  Skin prepped in a sterile fashion.  Local anesthesia: Topical Ethyl chloride.  With sterile technique and under real time ultrasound guidance: Noted normal-appearing posterior joint, 1 cc Kenalog 40, 2 cc lidocaine, 2 cc bupivacaine injected easily syringe switched and 30 mL of sterile saline used to fully hydrodistended capsule. Completed without difficulty  Advised to call if fevers/chills, erythema, induration, drainage, or persistent bleeding.  Images permanently stored and available for review in PACS.  Impression: Technically successful ultrasound guided injection.  Independent interpretation of notes and tests performed by another provider:   None.  Brief History, Exam, Impression, and Recommendations:    Adhesive capsulitis of left shoulder Persistent left shoulder pain, has failed physical therapy, subacromial injections have not been tremendously efficacious, she did go to sports medicine in [place] and they had suggested high-volume hydrodistention for frozen shoulder. Is not tremendously frozen today though she does have some limitations in abduction, external rotation and internal rotation, but she does have some glenohumeral signs, she also has weakness to abduction, I am concerned more about rotator cuff dysfunction. We did a high-volume hydrodistention today, return in a month, if insufficiently improved we will proceed with MRI.  Diabetes mellitus, type 2 Under good control, we will check all of her screening labs.  Annual physical exam Checking all routine labs, she is due for cervical cancer screening, last done in 2016, referral to our gynecology department  here.  Hypothyroidism TSH is a touch low considering overtreatment with levothyroxine, we are going to decrease her levothyroxine dose to 100 mcg, calling in new dose and we can recheck TSH in 6 weeks. Goal TSH less than 2.5.    ___________________________________________ Gwen Her. Dianah Field, M.D., ABFM., CAQSM. Primary Care and [place] Instructor of [place] [facility] of Medicine

## 2021-07-22 NOTE — Assessment & Plan Note (Signed)
Under good control, we will check all of her screening labs.

## 2021-07-22 NOTE — Assessment & Plan Note (Addendum)
Persistent left shoulder pain, has failed physical therapy, subacromial injections have not been tremendously efficacious, she did go to sports medicine in [place] and they had suggested high-volume hydrodistention for frozen shoulder. Is not tremendously frozen today though she does have some limitations in abduction, external rotation and internal rotation, but she does have some glenohumeral signs, she also has weakness to abduction, I am concerned more about rotator cuff dysfunction. We did a high-volume hydrodistention today, return in a month, if insufficiently improved we will proceed with MRI.

## 2021-07-23 ENCOUNTER — Telehealth: Payer: Self-pay | Admitting: *Deleted

## 2021-07-23 LAB — COMPREHENSIVE METABOLIC PANEL
AG Ratio: 1.7 (calc) (ref 1.0–2.5)
ALT: 22 U/L (ref 6–29)
AST: 25 U/L (ref 10–35)
Albumin: 4.4 g/dL (ref 3.6–5.1)
Alkaline phosphatase (APISO): 73 U/L (ref 37–153)
BUN: 14 mg/dL (ref 7–25)
CO2: 24 mmol/L (ref 20–32)
Calcium: 9.5 mg/dL (ref 8.6–10.4)
Chloride: 109 mmol/L (ref 98–110)
Creat: 0.88 mg/dL (ref 0.50–1.03)
Globulin: 2.6 g/dL (calc) (ref 1.9–3.7)
Glucose, Bld: 75 mg/dL (ref 65–99)
Potassium: 4 mmol/L (ref 3.5–5.3)
Sodium: 142 mmol/L (ref 135–146)
Total Bilirubin: 0.4 mg/dL (ref 0.2–1.2)
Total Protein: 7 g/dL (ref 6.1–8.1)

## 2021-07-23 LAB — CBC
HCT: 37.7 % (ref 35.0–45.0)
Hemoglobin: 12.1 g/dL (ref 11.7–15.5)
MCH: 28.8 pg (ref 27.0–33.0)
MCHC: 32.1 g/dL (ref 32.0–36.0)
MCV: 89.8 fL (ref 80.0–100.0)
MPV: 10.7 fL (ref 7.5–12.5)
Platelets: 215 10*3/uL (ref 140–400)
RBC: 4.2 10*6/uL (ref 3.80–5.10)
RDW: 13.1 % (ref 11.0–15.0)
WBC: 4.6 10*3/uL (ref 3.8–10.8)

## 2021-07-23 LAB — LIPID PANEL
Cholesterol: 208 mg/dL — ABNORMAL HIGH (ref <200)
HDL: 76 mg/dL (ref >=50)
LDL Cholesterol (Calc): 116 mg/dL (calc) — ABNORMAL HIGH
Non-HDL Cholesterol (Calc): 132 mg/dL (calc) — ABNORMAL HIGH (ref <130)
Total CHOL/HDL Ratio: 2.7 (calc) (ref <5.0)
Triglycerides: 65 mg/dL (ref <150)

## 2021-07-23 LAB — MICROALBUMIN / CREATININE URINE RATIO
Creatinine, Urine: 56 mg/dL (ref 20–275)
Microalb Creat Ratio: 4 mcg/mg creat (ref <30)
Microalb, Ur: 0.2 mg/dL

## 2021-07-23 LAB — TSH: TSH: 0.16 mIU/L — ABNORMAL LOW

## 2021-07-23 MED ORDER — LEVOTHYROXINE SODIUM 100 MCG PO TABS: 100.0000 ug | ORAL_TABLET | Freq: Every day | ORAL | 3 refills | Status: DC

## 2021-07-23 NOTE — Assessment & Plan Note (Signed)
TSH is a touch low considering overtreatment with levothyroxine, we are going to decrease her levothyroxine dose to 100 mcg, calling in new dose and we can recheck TSH in 6 weeks. Goal TSH less than 2.5.

## 2021-07-23 NOTE — Telephone Encounter (Signed)
Left patient a message to call and schedule referral appointment for a New GYN annual.

## 2021-07-23 NOTE — Addendum Note (Signed)
Addended by: Silverio Decamp on: 07/23/2021 09:21 AM   Modules accepted: Orders

## 2021-08-04 ENCOUNTER — Ambulatory Visit (INDEPENDENT_AMBULATORY_CARE_PROVIDER_SITE_OTHER): Payer: No Typology Code available for payment source | Admitting: Obstetrics and Gynecology

## 2021-08-04 ENCOUNTER — Other Ambulatory Visit: Payer: Self-pay

## 2021-08-04 ENCOUNTER — Encounter: Payer: Self-pay | Admitting: Obstetrics and Gynecology

## 2021-08-04 ENCOUNTER — Other Ambulatory Visit (HOSPITAL_COMMUNITY)
Admission: RE | Admit: 2021-08-04 | Discharge: 2021-08-04 | Disposition: A | Discharge status: Home / Self Care | Payer: No Typology Code available for payment source | Source: Ambulatory Visit | Attending: Obstetrics and Gynecology | Admitting: Obstetrics and Gynecology

## 2021-08-04 VITALS — BP 137/86 | HR 65 | Temp 97.4°F | Resp 16 | Ht 67.0 in | Wt 129.0 lb

## 2021-08-04 DIAGNOSIS — Z112 Encounter for screening for other bacterial diseases: Secondary | ICD-10-CM | POA: Insufficient documentation

## 2021-08-04 DIAGNOSIS — N898 Other specified noninflammatory disorders of vagina: Secondary | ICD-10-CM

## 2021-08-04 DIAGNOSIS — Z01419 Encounter for gynecological examination (general) (routine) without abnormal findings: Secondary | ICD-10-CM | POA: Diagnosis not present

## 2021-08-04 NOTE — Progress Notes (Addendum)
Javona Bergevin is a 55 y.o. G77P2000 female here for a routine annual gynecologic exam.  Current complaints: None.   Denies abnormal vaginal bleeding, discharge, pelvic pain, problems with intercourse or other gynecologic concerns.    Gynecologic History No LMP recorded. (Menstrual status: Other). Contraception: post menopausal status Last Pap: uncertain. Results were: normal Last mammogram: 11/2020. Results were: normal  Obstetric History OB History  Gravida Para Term Preterm AB Living  2 2 2         SAB IAB Ectopic Multiple Live Births               # Outcome Date GA Lbr Len/2nd Weight Sex Delivery Anes PTL Lv  2 Term           1 Term             Past Medical History:  Diagnosis Date   Cancer ([place])    Diabetes mellitus without complication ([place])    Herniated disc    Hypertension     Past Surgical History:  Procedure Laterality Date   THYROIDECTOMY      Current Outpatient Medications on File Prior to Visit  Medication Sig Dispense Refill   ACCU-CHEK GUIDE test strip USE TO TEST UP TO THREE TIMES DAILY WITH MEALS AND AT BEDTIME 300 each 0   Calcium Carb-Cholecalciferol (CALCIUM 1000 + D PO) Take by mouth.     fluticasone (FLONASE) 50 MCG/ACT nasal spray Two spray in each nostril twice a day, use left hand for right nostril, and right hand for left nostril. 48 g 3   Gabapentin, Once-Daily, (GRALISE) 600 MG TABS Take 600 mg by mouth.     HYDROcodone-acetaminophen (NORCO) 10-325 MG tablet Take 1 tablet by mouth as directed.     levothyroxine (SYNTHROID) 100 MCG tablet Take 1 tablet (100 mcg total) by mouth daily before breakfast. TAKE 1 TABLET DAILY BEFORE BREAKFAST 30 tablet 3   NONFORMULARY OR COMPOUNDED ITEM Lancets, strips and 1 glucometer, for testing up to 3 times a day with meals and at HS.  DX= E.11.9 300 each prn   omeprazole (PRILOSEC) 40 MG capsule Take 1 capsule (40 mg total) by mouth daily. 90 capsule 3   Semaglutide, 1 MG/DOSE, (OZEMPIC, 1 MG/DOSE,) 2 MG/1.5ML  SOPN Inject 1 mg into the skin once a week. 18 mL 3   topiramate (TOPAMAX) 100 MG tablet Take 1 tablet (100 mg total) by mouth 2 (two) times daily. 180 tablet 3   No current facility-administered medications on file prior to visit.    Allergies  Allergen Reactions   Mangifera Indica Swelling    Throat swelled Throat swelled   Pregabalin Swelling and Rash   Bee Venom Swelling    Social History   Socioeconomic History   Marital status: Married    Spouse name: Christia [place]   Number of children: 2   Years of education: Not on file   Highest education level: Some college, no degree  Occupational History   Not on file  Tobacco Use   Smoking status: Never   Smokeless tobacco: Never  Vaping Use   Vaping Use: Never used  Substance and Sexual Activity   Alcohol use: Yes    Comment: Socially   Drug use: No   Sexual activity: Yes  Other Topics Concern   Not on file  Social History Narrative   Not on file   Social Determinants of Health   Financial Resource Strain: Low Risk    Difficulty of Paying Living  Expenses: Not hard at all  Food Insecurity: No Food Insecurity   Worried About Charity fundraiser in the Last Year: Never true   Ran Out of Food in the Last Year: Never true  Transportation Needs: No Transportation Needs   Lack of Transportation (Medical): No   Lack of Transportation (Non-Medical): No  Physical Activity: Insufficiently Active   Days of Exercise per Week: 1 day   Minutes of Exercise per Session: 60 min  Stress: Stress Concern Present   Feeling of Stress : To some extent  Social Connections: Moderately Isolated   Frequency of Communication with Friends and Family: More than three times a week   Frequency of Social Gatherings with Friends and Family: Never   Attends Religious Services: Never   Marine scientist or Organizations: No   Attends Music therapist: Never   Marital Status: Married  Human resources officer Violence: Not At Risk   Fear of  Current or Ex-Partner: No   Emotionally Abused: No   Physically Abused: No   Sexually Abused: No    Family History  Problem Relation Age of Onset   Breast cancer Maternal Aunt     The following portions of the patient's history were reviewed and updated as appropriate: allergies, current medications, past family history, past medical history, past social history, past surgical history and problem list.  Review of Systems Pertinent items noted in HPI and remainder of comprehensive ROS otherwise negative.   Objective:  BP 137/86   Pulse 65   Temp (!) 97.4 F (36.3 C)   Resp 16   Ht 5\' 7"  (1.702 m)   Wt 129 lb (58.5 kg)   BMI 20.20 kg/m   Chaperone present during exam  CONSTITUTIONAL: Well-developed, well-nourished female in no acute distress.  HENT:  Normocephalic, atraumatic, External right and left ear normal. Oropharynx is clear and moist EYES: Conjunctivae and EOM are normal. Pupils are equal, round, and reactive to light. No scleral icterus.  NECK: Normal range of motion, supple, no masses.  Normal thyroid.  SKIN: Skin is warm and dry. No rash noted. Not diaphoretic. No erythema. No pallor. [place]: Alert and oriented to person, place, and time. Normal reflexes, muscle tone coordination. No cranial nerve deficit noted. PSYCHIATRIC: Normal mood and affect. Normal behavior. Normal judgment and thought content. CARDIOVASCULAR: Normal heart rate noted, regular rhythm RESPIRATORY: Clear to auscultation bilaterally. Effort and breath sounds normal, no problems with respiration noted. BREASTS: Symmetric in size. No masses, skin changes, nipple drainage, or lymphadenopathy. ABDOMEN: Soft, normal bowel sounds, no distention noted.  No tenderness, rebound or guarding.  PELVIC: Normal appearing external genitalia; normal appearing vaginal mucosa and cervix.  No abnormal discharge noted.  Pap smear obtained.  Normal uterine size, no other palpable masses, no uterine or adnexal  tenderness. MUSCULOSKELETAL: Normal range of motion. No tenderness.  No cyanosis, clubbing, or edema.  2+ distal pulses.   Assessment:  Annual gynecologic examination with pap smear Vaginal irration, History of Plan:  Will follow up results of pap smear and manage accordingly. Routine preventative health maintenance measures emphasized. Please refer to After Visit Summary for other counseling recommendations.    Chancy Milroy, MD, [place] Attending [place] for [facility], [place]

## 2021-08-05 LAB — CYTOLOGY - PAP
Comment: NEGATIVE
Diagnosis: NEGATIVE
High risk HPV: NEGATIVE

## 2021-08-05 LAB — CERVICOVAGINAL ANCILLARY ONLY
Bacterial Vaginitis (gardnerella): NEGATIVE
Candida Glabrata: NEGATIVE
Candida Vaginitis: NEGATIVE
Comment: NEGATIVE
Comment: NEGATIVE
Comment: NEGATIVE

## 2021-08-06 ENCOUNTER — Encounter: Payer: Self-pay | Admitting: *Deleted

## 2021-09-08 DIAGNOSIS — J339 Nasal polyp, unspecified: Secondary | ICD-10-CM

## 2021-09-10 ENCOUNTER — Ambulatory Visit (INDEPENDENT_AMBULATORY_CARE_PROVIDER_SITE_OTHER): Payer: No Typology Code available for payment source | Admitting: Sports Medicine

## 2021-09-10 ENCOUNTER — Other Ambulatory Visit: Payer: Self-pay

## 2021-09-10 DIAGNOSIS — R439 Unspecified disturbances of smell and taste: Secondary | ICD-10-CM

## 2021-09-10 DIAGNOSIS — M7502 Adhesive capsulitis of left shoulder: Secondary | ICD-10-CM | POA: Diagnosis not present

## 2021-09-10 MED ORDER — SALINE 2.65 % NA SOLN: NASAL | 0 refills | Status: DC

## 2021-09-10 NOTE — Assessment & Plan Note (Signed)
This is a pleasant 55 year old female, she complains of 2 weeks of abnormal smells, she is unable to describe the smells, she does have a bit of postnasal drip, she has had some nosebleeds. No facial pain or pressure, no swelling, no trauma, no new medications. I would like her to do hypertonic nasal saline over-the-counter 3-4 times a day, will do this for the next couple of weeks and if insufficient improvement we will probably send her to ENT, she prefers ENT off [address] in [place]. Exam for the most part was unremarkable with the exception of some dried blood in the right nare and some thick nasal mucus.

## 2021-09-10 NOTE — Progress Notes (Signed)
    Procedures performed today:    None.  Independent interpretation of notes and tests performed by another provider:   None.  Brief History, Exam, Impression, and Recommendations:    Dysosmia This is a pleasant 55 year old female, she complains of 2 weeks of abnormal smells, she is unable to describe the smells, she does have a bit of postnasal drip, she has had some nosebleeds. No facial pain or pressure, no swelling, no trauma, no new medications. I would like her to do hypertonic nasal saline over-the-counter 3-4 times a day, will do this for the next couple of weeks and if insufficient improvement we will probably send her to ENT, she prefers ENT off [address] in [place]. Exam for the most part was unremarkable with the exception of some dried blood in the right nare and some thick nasal mucus.  Adhesive capsulitis of left shoulder Resolved after ultrasound-guided high-volume hydrodistention at the last visit.    ___________________________________________ Gwen Her. Dianah Field, M.D., ABFM., CAQSM. Primary Care and [place] Instructor of [place] of [facility] of Medicine

## 2021-09-10 NOTE — Assessment & Plan Note (Addendum)
Resolved after ultrasound-guided high-volume hydrodistention at the last visit.

## 2021-09-25 ENCOUNTER — Encounter (INDEPENDENT_AMBULATORY_CARE_PROVIDER_SITE_OTHER): Payer: No Typology Code available for payment source | Admitting: Sports Medicine

## 2021-09-25 DIAGNOSIS — R439 Unspecified disturbances of smell and taste: Secondary | ICD-10-CM | POA: Diagnosis not present

## 2021-09-26 ENCOUNTER — Other Ambulatory Visit: Payer: Self-pay | Admitting: Sports Medicine

## 2021-09-26 DIAGNOSIS — E6609 Other obesity due to excess calories: Secondary | ICD-10-CM

## 2021-09-26 DIAGNOSIS — R635 Abnormal weight gain: Secondary | ICD-10-CM

## 2021-09-29 NOTE — Telephone Encounter (Signed)
I spent 5 total minutes of online digital evaluation and management services in this patient-initiated request for online care. 

## 2021-11-27 ENCOUNTER — Encounter: Payer: Self-pay | Admitting: Sports Medicine

## 2021-12-01 ENCOUNTER — Ambulatory Visit: Payer: No Typology Code available for payment source | Admitting: Sports Medicine

## 2021-12-01 ENCOUNTER — Other Ambulatory Visit: Payer: Self-pay

## 2021-12-01 ENCOUNTER — Ambulatory Visit (INDEPENDENT_AMBULATORY_CARE_PROVIDER_SITE_OTHER): Payer: No Typology Code available for payment source

## 2021-12-01 DIAGNOSIS — M7502 Adhesive capsulitis of left shoulder: Secondary | ICD-10-CM

## 2021-12-01 DIAGNOSIS — M25552 Pain in left hip: Secondary | ICD-10-CM | POA: Insufficient documentation

## 2021-12-01 MED ORDER — HYDROCODONE-ACETAMINOPHEN 10-325 MG PO TABS: 1.0000 | ORAL_TABLET | Freq: Three times a day (TID) | ORAL | 0 refills | Status: DC | PRN

## 2021-12-01 NOTE — Assessment & Plan Note (Signed)
Last injection was in October 2022, repeat left glenohumeral injection today due to recurrence of pain. Return as needed. If this recurs we will consider shoulder MRI and surgical referral.

## 2021-12-01 NOTE — Assessment & Plan Note (Signed)
Acute left hip pain, localized anteriorly and laterally, patient does use the C sign when describing pain, pain with internal rotation, not with flexion. As this is acute we will treat conservatively, x-rays, home conditioning exercises. Return if no better in 6 weeks and will consider MR arthrography. 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 Aretha Parrot at [facility].

## 2021-12-01 NOTE — Patient Instructions (Signed)
° ° ° ° ° ° ° ° ° ° ° ° ° ° ° ° ° ° ° ° ° ° ° ° ° ° ° ° ° ° ° ° ° ° ° ° ° ° ° ° ° ° ° ° ° ° ° ° ° ° ° ° ° ° ° ° ° ° ° ° ° ° ° ° ° ° ° ° ° ° ° ° ° ° ° ° ° ° ° ° ° ° ° ° ° ° ° ° ° ° ° ° ° ° ° ° ° ° ° ° ° ° ° ° ° ° ° ° ° ° ° ° ° ° ° ° ° ° ° ° ° ° ° ° ° ° ° ° ° ° ° ° ° °  Meaningful Use Checks                              Objectives needing attention        Medication reconciliation has not been performed for this encounter within the encounter's calendar year.      Objectives met        No medications eligible for e-prescribing have been ordered.         All required demographic data has been recorded.        All required vitals have been recorded.        Smoking status has been recorded.        The patient (or proxy) has access to MyChart.        The patient last viewed her health information on MyChart or accessed it using APIs on 11/27/2021.        The AVS has been provided.        Patient education materials are available on MyChart.         Communications            New Communication Send All              Method of Visit      Charge Capture      Follow-up      Review      After Visit Summary      LOS                        Method of Visit                    [place]            Office Visit from 12/01/2021 in [place]     12/01/2021    [phone]

## 2021-12-01 NOTE — Addendum Note (Signed)
Addended by: Silverio Decamp on: 12/01/2021 08:54 AM   Modules accepted: Orders

## 2021-12-01 NOTE — Progress Notes (Signed)
° ° °  Procedures performed today:    Procedure: Real-time Ultrasound Guided injection of the left glenohumeral Device: Samsung HS60  Verbal informed consent obtained.  Time-out conducted.  Noted no overlying erythema, induration, or other signs of local infection.  Skin prepped in a sterile fashion.  Local anesthesia: Topical Ethyl chloride.  With sterile technique and under real time ultrasound guidance: Noted normal-appearing joint, 1 cc Kenalog 40, 2 cc lidocaine, 2 cc bupivacaine injected easily Completed without difficulty  Advised to call if fevers/chills, erythema, induration, drainage, or persistent bleeding.  Images permanently stored and available for review in PACS.  Impression: Technically successful ultrasound guided injection.  Independent interpretation of notes and tests performed by another provider:   None.  Brief History, Exam, Impression, and Recommendations:    Acute hip pain, left Acute left hip pain, localized anteriorly and laterally, patient does use the C sign when describing pain, pain with internal rotation, not with flexion. As this is acute we will treat conservatively, x-rays, home conditioning exercises. Return if no better in 6 weeks and will consider MR arthrography. 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 Aretha Parrot at [facility].   Adhesive capsulitis of left shoulder Last injection was in October 2022, repeat left glenohumeral injection today due to recurrence of pain. Return as needed. If this recurs we will consider shoulder MRI and surgical referral.    ___________________________________________ Gwen Her. Dianah Field, M.D., ABFM., CAQSM. Primary Care and [place] Instructor of [employer] [employer]

## 2021-12-20 ENCOUNTER — Other Ambulatory Visit: Payer: Self-pay | Admitting: Sports Medicine

## 2021-12-28 ENCOUNTER — Other Ambulatory Visit: Payer: Self-pay | Admitting: Sports Medicine

## 2021-12-28 DIAGNOSIS — E034 Atrophy of thyroid (acquired): Secondary | ICD-10-CM

## 2022-01-13 ENCOUNTER — Ambulatory Visit: Payer: No Typology Code available for payment source | Admitting: Sports Medicine

## 2022-04-01 ENCOUNTER — Other Ambulatory Visit: Payer: Self-pay | Admitting: Sports Medicine

## 2022-04-01 DIAGNOSIS — E034 Atrophy of thyroid (acquired): Secondary | ICD-10-CM

## 2022-04-10 ENCOUNTER — Ambulatory Visit: Payer: No Typology Code available for payment source | Admitting: Sports Medicine

## 2022-04-10 DIAGNOSIS — M7502 Adhesive capsulitis of left shoulder: Secondary | ICD-10-CM | POA: Diagnosis not present

## 2022-04-10 DIAGNOSIS — E034 Atrophy of thyroid (acquired): Secondary | ICD-10-CM

## 2022-04-10 DIAGNOSIS — E119 Type 2 diabetes mellitus without complications: Secondary | ICD-10-CM | POA: Diagnosis not present

## 2022-04-10 DIAGNOSIS — Z0000 Encounter for general adult medical examination without abnormal findings: Secondary | ICD-10-CM

## 2022-04-10 MED ORDER — LEVOTHYROXINE SODIUM 100 MCG PO TABS: 100.0000 ug | ORAL_TABLET | Freq: Every day | ORAL | 0 refills | Status: DC

## 2022-04-10 MED ORDER — SEMAGLUTIDE (2 MG/DOSE) 8 MG/3ML ~~LOC~~ SOPN: 2.0000 mg | PEN_INJECTOR | SUBCUTANEOUS | 0 refills | Status: DC

## 2022-04-10 MED ORDER — OMEPRAZOLE 40 MG PO CPDR: 40.0000 mg | DELAYED_RELEASE_CAPSULE | Freq: Every day | ORAL | 0 refills | Status: DC

## 2022-04-10 MED ORDER — GRALISE 600 MG PO TABS: 600.0000 mg | ORAL_TABLET | Freq: Every day | ORAL | 0 refills | Status: AC

## 2022-04-10 NOTE — Assessment & Plan Note (Signed)
Nancy Poole and her family are moving to [place] in about 10 days. For the next year I am happy to send her prescriptions to her mail order pharmacy, [place], she does agree to get her address changed.

## 2022-04-10 NOTE — Progress Notes (Signed)
    Procedures performed today:    None.  Independent interpretation of notes and tests performed by another provider:   None.  Brief History, Exam, Impression, and Recommendations:    Adhesive capsulitis of left shoulder Pleasant 56 year old female, persistent pain, historically symptomatology looks like adhesive capsulitis, she had a good left glenohumeral joint injection back in January of this year. Unfortunately having recurrent pain, at this point she has had months of physical therapy, chiropractic manipulation, x-rays. Proceeding with MRI left shoulder without contrast, hopefully for this weekend. If she does need operative intervention it would likely need to be scheduled in [place].  Diabetes mellitus, type 2 Rechecking labs for diabetes, she has noted some weight gain on 1 mg of Ozempic so we will switch to 2 mg.  Annual physical exam Cecille Rubin and her family are moving to [place] in about 10 days. For the next year I am happy to send her prescriptions to her mail order pharmacy, [place], she does agree to get her address changed.   ___________________________________________ Gwen Her. Dianah Field, M.D., ABFM., CAQSM. Primary Care and [place] Instructor of [place] [employer] [facility] [employer]

## 2022-04-10 NOTE — Assessment & Plan Note (Signed)
Pleasant 56 year old female, persistent pain, historically symptomatology looks like adhesive capsulitis, she had a good left glenohumeral joint injection back in January of this year. Unfortunately having recurrent pain, at this point she has had months of physical therapy, chiropractic manipulation, x-rays. Proceeding with MRI left shoulder without contrast, hopefully for this weekend. If she does need operative intervention it would likely need to be scheduled in [place].

## 2022-04-10 NOTE — Patient Instructions (Addendum)
[facility]:  [phone]

## 2022-04-10 NOTE — Assessment & Plan Note (Signed)
Rechecking labs for diabetes, she has noted some weight gain on 1 mg of Ozempic so we will switch to 2 mg.

## 2022-04-11 LAB — LIPID PANEL
Cholesterol: 175 mg/dL (ref <200)
HDL: 71 mg/dL (ref >=50)
LDL Cholesterol (Calc): 91 mg/dL (calc)
Non-HDL Cholesterol (Calc): 104 mg/dL (calc) (ref <130)
Total CHOL/HDL Ratio: 2.5 (calc) (ref <5.0)
Triglycerides: 51 mg/dL (ref <150)

## 2022-04-11 LAB — T3, FREE: T3, Free: 2.8 pg/mL (ref 2.3–4.2)

## 2022-04-11 LAB — CBC
HCT: 34.3 % — ABNORMAL LOW (ref 35.0–45.0)
Hemoglobin: 11.3 g/dL — ABNORMAL LOW (ref 11.7–15.5)
MCH: 28.8 pg (ref 27.0–33.0)
MCHC: 32.9 g/dL (ref 32.0–36.0)
MCV: 87.5 fL (ref 80.0–100.0)
MPV: 10.2 fL (ref 7.5–12.5)
Platelets: 197 10*3/uL (ref 140–400)
RBC: 3.92 10*6/uL (ref 3.80–5.10)
RDW: 13.1 % (ref 11.0–15.0)
WBC: 4.5 10*3/uL (ref 3.8–10.8)

## 2022-04-11 LAB — COMPLETE METABOLIC PANEL WITH GFR
AG Ratio: 1.7 (calc) (ref 1.0–2.5)
ALT: 16 U/L (ref 6–29)
AST: 22 U/L (ref 10–35)
Albumin: 4.1 g/dL (ref 3.6–5.1)
Alkaline phosphatase (APISO): 79 U/L (ref 37–153)
BUN: 18 mg/dL (ref 7–25)
CO2: 25 mmol/L (ref 20–32)
Calcium: 8.8 mg/dL (ref 8.6–10.4)
Chloride: 110 mmol/L (ref 98–110)
Creat: 0.91 mg/dL (ref 0.50–1.03)
Globulin: 2.4 g/dL (calc) (ref 1.9–3.7)
Glucose, Bld: 71 mg/dL (ref 65–99)
Potassium: 4 mmol/L (ref 3.5–5.3)
Sodium: 142 mmol/L (ref 135–146)
Total Bilirubin: 0.5 mg/dL (ref 0.2–1.2)
Total Protein: 6.5 g/dL (ref 6.1–8.1)
eGFR: 74 mL/min/{1.73_m2} (ref >=60)

## 2022-04-11 LAB — MICROALBUMIN / CREATININE URINE RATIO
Creatinine, Urine: 196 mg/dL (ref 20–275)
Microalb Creat Ratio: 5 mcg/mg creat (ref <30)
Microalb, Ur: 0.9 mg/dL

## 2022-04-11 LAB — TSH: TSH: 0.19 mIU/L — ABNORMAL LOW (ref 0.40–4.50)

## 2022-04-11 LAB — HEMOGLOBIN A1C
Hgb A1c MFr Bld: 5.2 % of total Hgb (ref <5.7)
Mean Plasma Glucose: 103 mg/dL
eAG (mmol/L): 5.7 mmol/L

## 2022-04-11 LAB — T4, FREE: Free T4: 1.4 ng/dL (ref 0.8–1.8)

## 2022-04-24 ENCOUNTER — Encounter (INDEPENDENT_AMBULATORY_CARE_PROVIDER_SITE_OTHER): Payer: No Typology Code available for payment source | Admitting: Sports Medicine

## 2022-04-24 DIAGNOSIS — E034 Atrophy of thyroid (acquired): Secondary | ICD-10-CM

## 2022-04-24 NOTE — Telephone Encounter (Signed)
I spent 5 total minutes of online digital evaluation and management services in this patient-initiated request for online care. 

## 2022-04-27 NOTE — Addendum Note (Signed)
Addended by: Silverio Decamp on: 04/27/2022 08:30 AM   Modules accepted: Orders

## 2022-04-28 NOTE — Telephone Encounter (Signed)
Okay, please fax the orders to them.

## 2022-05-04 ENCOUNTER — Other Ambulatory Visit: Payer: Self-pay

## 2022-05-04 DIAGNOSIS — E119 Type 2 diabetes mellitus without complications: Secondary | ICD-10-CM

## 2022-05-04 MED ORDER — SEMAGLUTIDE (2 MG/DOSE) 8 MG/3ML ~~LOC~~ SOPN: 2.0000 mg | PEN_INJECTOR | SUBCUTANEOUS | 0 refills | Status: DC

## 2022-05-05 ENCOUNTER — Other Ambulatory Visit: Payer: Self-pay | Admitting: Sports Medicine

## 2022-05-05 DIAGNOSIS — E119 Type 2 diabetes mellitus without complications: Secondary | ICD-10-CM

## 2022-05-06 ENCOUNTER — Other Ambulatory Visit: Payer: Self-pay

## 2022-05-06 DIAGNOSIS — R635 Abnormal weight gain: Secondary | ICD-10-CM

## 2022-05-06 DIAGNOSIS — E6609 Other obesity due to excess calories: Secondary | ICD-10-CM

## 2022-05-06 DIAGNOSIS — E119 Type 2 diabetes mellitus without complications: Secondary | ICD-10-CM

## 2022-05-06 LAB — TSH: TSH: 0.13 — AB (ref 0.41–5.90)

## 2022-05-06 MED ORDER — TOPIRAMATE 100 MG PO TABS: 100.0000 mg | ORAL_TABLET | Freq: Two times a day (BID) | ORAL | 1 refills | Status: AC

## 2022-05-06 MED ORDER — SEMAGLUTIDE (2 MG/DOSE) 8 MG/3ML ~~LOC~~ SOPN: 2.0000 mg | PEN_INJECTOR | SUBCUTANEOUS | 1 refills | Status: DC

## 2022-05-06 MED ORDER — OMEPRAZOLE 40 MG PO CPDR: 40.0000 mg | DELAYED_RELEASE_CAPSULE | Freq: Every day | ORAL | 1 refills | Status: AC

## 2022-05-08 DIAGNOSIS — E034 Atrophy of thyroid (acquired): Secondary | ICD-10-CM | POA: Diagnosis not present

## 2022-05-08 MED ORDER — LEVOTHYROXINE SODIUM 88 MCG PO TABS: 88.0000 ug | ORAL_TABLET | Freq: Every day | ORAL | 3 refills | Status: DC

## 2022-05-08 NOTE — Telephone Encounter (Signed)
Total time spent now 11 minutes.

## 2022-05-08 NOTE — Addendum Note (Signed)
Addended by: Silverio Decamp on: 05/08/2022 02:01 PM   Modules accepted: Orders

## 2022-05-08 NOTE — Assessment & Plan Note (Signed)
TSH is too low, decreasing levothyroxine to 88 mcg.  Goal TSH less than 2.5.

## 2022-05-08 NOTE — Telephone Encounter (Signed)
I spent 5 total minutes of online digital evaluation and management services in this patient-initiated request for online care. 

## 2022-05-11 ENCOUNTER — Encounter: Payer: Self-pay | Admitting: Sports Medicine

## 2022-05-11 LAB — T3, FREE: T3, Free: 3

## 2022-05-11 LAB — T4, FREE: Free T4: 1.3

## 2022-05-13 MED ORDER — LEVOTHYROXINE SODIUM 88 MCG PO TABS: 88.0000 ug | ORAL_TABLET | Freq: Every day | ORAL | 3 refills | Status: AC

## 2022-05-13 NOTE — Addendum Note (Signed)
Addended by: Narda Rutherford on: 05/13/2022 02:40 PM   Modules accepted: Orders

## 2022-05-30 ENCOUNTER — Other Ambulatory Visit: Payer: Self-pay | Admitting: Sports Medicine

## 2022-05-30 DIAGNOSIS — E119 Type 2 diabetes mellitus without complications: Secondary | ICD-10-CM

## 2022-06-01 ENCOUNTER — Other Ambulatory Visit: Payer: Self-pay | Admitting: Sports Medicine

## 2022-06-01 DIAGNOSIS — E119 Type 2 diabetes mellitus without complications: Secondary | ICD-10-CM

## 2022-07-01 ENCOUNTER — Other Ambulatory Visit: Payer: Self-pay | Admitting: Sports Medicine

## 2022-07-01 DIAGNOSIS — E119 Type 2 diabetes mellitus without complications: Secondary | ICD-10-CM

## 2022-07-01 MED ORDER — ROSUVASTATIN CALCIUM 5 MG PO TABS: 5.0000 mg | ORAL_TABLET | Freq: Every day | ORAL | 3 refills | Status: AC

## 2022-07-01 NOTE — Assessment & Plan Note (Signed)
Received notice from the pharmacy patient with diabetes type 2, she understands statin therapy would reduce cardiovascular mortality so we will send in 5 mg of rosuvastatin.

## 2024-07-11 ENCOUNTER — Encounter: Payer: Self-pay | Admitting: Sports Medicine
